# Patient Record
Sex: Female | Born: 1960 | Race: White | Hispanic: No | Marital: Married | Smoking: Never smoker
Health system: Southern US, Community
[De-identification: ages and names within clinical notes are randomized; demographics above are authoritative.]

## PROBLEM LIST (undated history)

## (undated) DIAGNOSIS — T7840XA Allergy, unspecified, initial encounter: Secondary | ICD-10-CM

## (undated) DIAGNOSIS — J45909 Unspecified asthma, uncomplicated: Secondary | ICD-10-CM

## (undated) DIAGNOSIS — K760 Fatty (change of) liver, not elsewhere classified: Secondary | ICD-10-CM

## (undated) DIAGNOSIS — F32A Depression, unspecified: Secondary | ICD-10-CM

## (undated) DIAGNOSIS — I10 Essential (primary) hypertension: Secondary | ICD-10-CM

## (undated) DIAGNOSIS — K589 Irritable bowel syndrome without diarrhea: Secondary | ICD-10-CM

## (undated) HISTORY — DX: Allergy, unspecified, initial encounter: T78.40XA

## (undated) HISTORY — DX: Irritable bowel syndrome, unspecified: K58.9

## (undated) HISTORY — DX: Essential (primary) hypertension: I10

---

## 1997-12-17 ENCOUNTER — Other Ambulatory Visit: Admission: RE | Admit: 1997-12-17 | Discharge: 1997-12-17 | Payer: Self-pay | Admitting: Obstetrics and Gynecology

## 1998-12-23 ENCOUNTER — Other Ambulatory Visit: Admission: RE | Admit: 1998-12-23 | Discharge: 1998-12-23 | Payer: Self-pay | Admitting: Obstetrics and Gynecology

## 1999-03-21 ENCOUNTER — Ambulatory Visit (HOSPITAL_COMMUNITY): Admission: RE | Admit: 1999-03-21 | Discharge: 1999-03-21 | Payer: Self-pay | Admitting: Obstetrics and Gynecology

## 1999-03-21 ENCOUNTER — Encounter: Payer: Self-pay | Admitting: Obstetrics and Gynecology

## 1999-06-13 HISTORY — PX: OVARIAN CYST REMOVAL: SHX89

## 2001-02-21 ENCOUNTER — Other Ambulatory Visit: Admission: RE | Admit: 2001-02-21 | Discharge: 2001-02-21 | Payer: Self-pay | Admitting: Gynecology

## 2002-02-13 ENCOUNTER — Other Ambulatory Visit: Admission: RE | Admit: 2002-02-13 | Discharge: 2002-02-13 | Payer: Self-pay | Admitting: Gynecology

## 2003-02-05 ENCOUNTER — Other Ambulatory Visit: Admission: RE | Admit: 2003-02-05 | Discharge: 2003-02-05 | Payer: Self-pay | Admitting: Gynecology

## 2004-01-11 ENCOUNTER — Other Ambulatory Visit: Admission: RE | Admit: 2004-01-11 | Discharge: 2004-01-11 | Payer: Self-pay | Admitting: Gynecology

## 2005-03-09 ENCOUNTER — Other Ambulatory Visit: Admission: RE | Admit: 2005-03-09 | Discharge: 2005-03-09 | Payer: Self-pay | Admitting: Gynecology

## 2006-04-05 ENCOUNTER — Other Ambulatory Visit: Admission: RE | Admit: 2006-04-05 | Discharge: 2006-04-05 | Payer: Self-pay | Admitting: Gynecology

## 2007-05-02 ENCOUNTER — Other Ambulatory Visit: Admission: RE | Admit: 2007-05-02 | Discharge: 2007-05-02 | Payer: Self-pay | Admitting: Gynecology

## 2007-06-13 HISTORY — PX: REDUCTION MAMMAPLASTY: SUR839

## 2007-06-13 HISTORY — PX: BREAST SURGERY: SHX581

## 2008-04-16 ENCOUNTER — Other Ambulatory Visit: Admission: RE | Admit: 2008-04-16 | Discharge: 2008-04-16 | Payer: Self-pay | Admitting: Gynecology

## 2010-06-12 LAB — HM MAMMOGRAPHY

## 2012-06-12 HISTORY — PX: FOOT SURGERY: SHX648

## 2012-12-23 LAB — HM COLONOSCOPY

## 2013-04-28 ENCOUNTER — Encounter: Payer: Self-pay | Admitting: Internal Medicine

## 2013-04-29 ENCOUNTER — Encounter: Payer: 59 | Admitting: Internal Medicine

## 2013-04-29 DIAGNOSIS — I10 Essential (primary) hypertension: Secondary | ICD-10-CM | POA: Insufficient documentation

## 2013-04-29 NOTE — Progress Notes (Signed)
Patient ID: Donna Chapman, female   DOB: Jun 02, 1961, 52 y.o.   MRN: [id number]   Annual Screening Comprehensive Examination   This very nice 52 yo MWF presents for complete physical.  Patient has no major health issues.    She does have history of volume dependent Hypertension which has been easily controlled with a diuretic.     Finally, the patient has history of Vitamin D Deficiency with last vitamin D      Current outpatient prescriptions: aspirin EC 81 MG tablet, Take 81 mg by mouth daily. 3 times per week, Disp: , Rfl: ;   buPROPion (WELLBUTRIN XL) 150 MG 24 hr tablet, Take 150 mg by mouth daily., Disp: , Rfl: ;  Cholecalciferol (VITAMIN D-3) 5000 UNITS TABS, Take 5,000 Units by mouth., Disp: , Rfl: ;  hydrochlorothiazide (HYDRODIURIL) 25 MG tablet, Take 25 mg by mouth daily., Disp: , Rfl:  loratadine (CLARITIN) 10 MG tablet, Take 10 mg by mouth daily., Disp: , Rfl:    Allergies not on file  Past Medical History  Diagnosis Date  . Hypertension   . Allergy     Past Surgical History  Procedure Laterality Date  . Ovarian cyst removal  2001  . Breast surgery Bilateral 2009    Reduction    Family History  Problem Relation Age of Onset  . Hypertension Mother   . Hypertension Father     History   Social History  . Marital Status: Married    Spouse Name: N/A    Number of Children: N/A  . Years of Education: N/A   Occupational History  . Not on file.   Social History Main Topics  . Smoking status: Never Smoker   . Smokeless tobacco: Not on file  . Alcohol Use: Yes  . Drug Use: No  . Sexual Activity: Not on file   Other Topics Concern  . Not on file   Social History Narrative  . No narrative on file    ROS Constitutional: Denies fever, chills, weight loss/gain, headaches, insomnia, fatigue, night sweats, and change in appetite. Eyes: Denies redness, blurred vision, diplopia, discharge, itchy, watery eyes.  ENT: Denies discharge, congestion, post  nasal drip, epistaxis, sore throat, earache, hearing loss, dental pain, Tinnitus, Vertigo, Sinus pain, snoring.  Cardio: Denies chest pain, palpitations, irregular heartbeat, syncope, dyspnea, diaphoresis, orthopnea, PND, claudication, edema Respiratory: denies cough, dyspnea, DOE, pleurisy, hoarseness, laryngitis, wheezing.  Gastrointestinal: Denies dysphagia, heartburn, reflux, water brash, pain, cramps, nausea, vomiting, bloating, diarrhea, constipation, hematemesis, melena, hematochezia, jaundice, hemorrhoids Genitourinary: Denies dysuria, frequency, urgency, nocturia, hesitancy, discharge, hematuria, flank pain Breast: Breast lumps, nipple discharge, bleeding.  Musculoskeletal: Denies arthralgia, myalgia, stiffness, Jt. Swelling, pain, limp, and strain/sprain. Skin: Denies puritis, rash, hives, warts, acne, eczema, changing in skin lesion Neuro: Weakness, tremor, incoordination, spasms, paresthesia, pain Psychiatric: Denies confusion, memory loss, sensory loss. Endocrine: Denies change in weight, skin, hair change, nocturia, and paresthesia, diabetic polys, visual blurring, hyper /hypo glycemic episodes.  Heme/Lymph: No excessive bleeding, bruising, enlarged lymph nodes.  There were no vitals filed for this visit. There is no height or weight on file to calculate BMI.  Physical Exam General Appearance: Well nourished, in no apparent distress. Eyes: PERRLA, EOMs, conjunctiva no swelling or erythema, normal fundi and vessels. Sinuses: No frontal/maxillary tenderness ENT/Mouth: EACs patent / TMs  nl. Nares clear without erythema, swelling, mucoid exudates. Oral hygiene is good. No erythema, swelling, or exudate. Tongue normal, non-obstructing. Tonsils not swollen or erythematous. Hearing normal.  Neck: Supple,  thyroid normal. No bruits, nodes or JVD. Respiratory: Respiratory effort normal.  BS equal and clear bilateral without rales, rhonci, wheezing or stridor. Cardio: Heart sounds are  normal with regular rate and rhythm and no murmurs, rubs or gallops. Peripheral pulses are normal and equal bilaterally without edema. No aortic or femoral bruits. Chest: symmetric with normal excursions and percussion. Breasts: Symmetric, without lumps, nipple discharge, retractions, or fibrocystic changes.  Abdomen: Flat, soft, with bowl sounds. Nontender, no guarding, rebound, hernias, masses, or organomegaly.  Lymphatics: Non tender without lymphadenopathy.  Genitourinary:  Musculoskeletal: Full ROM all peripheral extremities, joint stability, 5/5 strength, and normal gait. Skin: Warm and dry without rashes, lesions, cyanosis, clubbing or  ecchymosis.  Neuro: Cranial nerves intact, reflexes equal bilaterally. Normal muscle tone, no cerebellar symptoms. Sensation intact.  Pysch: Awake and oriented X 3, normal affect, Insight and Judgment appropriate.   Assessment and Plan  1. Annual Screening Examination 2. HTN 3.   4.    Continue prudent diet as discussed, weight control, regular exercise, and medications. Routine screening labs and tests as requested with regular follow-up as recommended.     This encounter was created in error - please disregard. This encounter was created in error - please disregard.

## 2013-06-12 HISTORY — PX: COLONOSCOPY: SHX174

## 2013-06-26 ENCOUNTER — Other Ambulatory Visit: Payer: Self-pay | Admitting: Emergency Medicine

## 2013-06-26 MED ORDER — FLUTICASONE PROPIONATE 50 MCG/ACT NA SUSP: 1.0000 | Freq: Every day | NASAL | Status: DC

## 2013-07-04 ENCOUNTER — Other Ambulatory Visit: Payer: Self-pay | Admitting: Physician Assistant

## 2013-07-04 MED ORDER — BUPROPION HCL ER (XL) 150 MG PO TB24: 150.0000 mg | ORAL_TABLET | Freq: Every day | ORAL | Status: DC

## 2013-10-09 ENCOUNTER — Encounter: Payer: Self-pay | Admitting: Internal Medicine

## 2013-10-09 ENCOUNTER — Ambulatory Visit (INDEPENDENT_AMBULATORY_CARE_PROVIDER_SITE_OTHER): Payer: 59 | Admitting: Internal Medicine

## 2013-10-09 VITALS — BP 102/76 | HR 80 | Temp 97.7°F | Resp 16 | Ht 64.0 in | Wt 181.0 lb

## 2013-10-09 DIAGNOSIS — R7402 Elevation of levels of lactic acid dehydrogenase (LDH): Secondary | ICD-10-CM

## 2013-10-09 DIAGNOSIS — Z1212 Encounter for screening for malignant neoplasm of rectum: Secondary | ICD-10-CM

## 2013-10-09 DIAGNOSIS — R740 Nonspecific elevation of levels of transaminase and lactic acid dehydrogenase [LDH]: Secondary | ICD-10-CM

## 2013-10-09 DIAGNOSIS — E559 Vitamin D deficiency, unspecified: Secondary | ICD-10-CM

## 2013-10-09 DIAGNOSIS — Z113 Encounter for screening for infections with a predominantly sexual mode of transmission: Secondary | ICD-10-CM

## 2013-10-09 DIAGNOSIS — Z111 Encounter for screening for respiratory tuberculosis: Secondary | ICD-10-CM

## 2013-10-09 DIAGNOSIS — Z0000 Encounter for general adult medical examination without abnormal findings: Secondary | ICD-10-CM

## 2013-10-09 DIAGNOSIS — I10 Essential (primary) hypertension: Secondary | ICD-10-CM

## 2013-10-09 LAB — CBC WITH DIFFERENTIAL/PLATELET
Basophils Absolute: 0 10*3/uL (ref 0.0–0.1)
Basophils Relative: 0 % (ref 0–1)
EOS ABS: 0.4 10*3/uL (ref 0.0–0.7)
Eosinophils Relative: 4 % (ref 0–5)
HEMATOCRIT: 43.7 % (ref 36.0–46.0)
Hemoglobin: 15 g/dL (ref 12.0–15.0)
LYMPHS ABS: 3.2 10*3/uL (ref 0.7–4.0)
LYMPHS PCT: 36 % (ref 12–46)
MCH: 30.7 pg (ref 26.0–34.0)
MCHC: 34.3 g/dL (ref 30.0–36.0)
MCV: 89.5 fL (ref 78.0–100.0)
MONO ABS: 0.6 10*3/uL (ref 0.1–1.0)
MONOS PCT: 7 % (ref 3–12)
Neutro Abs: 4.8 10*3/uL (ref 1.7–7.7)
Neutrophils Relative %: 53 % (ref 43–77)
PLATELETS: 186 10*3/uL (ref 150–400)
RBC: 4.88 MIL/uL (ref 3.87–5.11)
RDW: 13.7 % (ref 11.5–15.5)
WBC: 9 10*3/uL (ref 4.0–10.5)

## 2013-10-09 MED ORDER — TERBINAFINE HCL 250 MG PO TABS: 250.0000 mg | ORAL_TABLET | Freq: Every day | ORAL | Status: DC

## 2013-10-09 MED ORDER — ALPRAZOLAM 0.5 MG PO TABS: ORAL_TABLET | ORAL | Status: AC

## 2013-10-09 MED ORDER — BUPROPION HCL ER (XL) 150 MG PO TB24: 150.0000 mg | ORAL_TABLET | Freq: Every day | ORAL | Status: DC

## 2013-10-09 MED ORDER — HYDROCHLOROTHIAZIDE 25 MG PO TABS: 25.0000 mg | ORAL_TABLET | Freq: Every day | ORAL | Status: DC

## 2013-10-09 NOTE — Progress Notes (Signed)
Patient ID: Donna Chapman, female   DOB: 1961-03-13, 53 y.o.   MRN: [id number]   Annual Screening Comprehensive Examination  This very nice 53 y.o. MWF presents for complete physical.  Patient has been followed for HTN and Vitamin D Deficiency.    HTN predates since  2010. Patient's BP has been controlled and today's BP is 102/76 mmHg. Patient denies any cardiac symptoms as chest pain, palpitations, shortness of breath, dizziness or ankle swelling.   Finally, patient has history of Vitamin D Deficiency of 47 in 2009 and last vitamin D was 70 in Nov 2013.    Medication List       ALPRAZolam 0.5 MG tablet  Commonly known as:  XANAX  Take 1/2 to 1 tablet 3 x daily as needed for anxiety or sleep     aspirin EC 81 MG tablet  Take 81 mg by mouth daily. 3 times per week     buPROPion 150 MG 24 hr tablet  Commonly known as:  WELLBUTRIN XL  Take 1 tablet (150 mg total) by mouth daily. For mood or concentration     estradiol-norethindrone 0.05-0.14 MG/DAY  Commonly known as:  COMBIPATCH  Place 1 patch onto the skin 2 (two) times a week.     hydrochlorothiazide 25 MG tablet  Commonly known as:  HYDRODIURIL  Take 1 tablet (25 mg total) by mouth daily. For BP & fluid     loratadine 10 MG tablet  Commonly known as:  CLARITIN  Take 10 mg by mouth daily.     NASACORT AQ NA  Place into the nose. 1 spray into nostrils daily-OTC     OVER THE COUNTER MEDICATION  Osteobioflex 1 daily     OVER THE COUNTER MEDICATION  Aleve 2 tabs daily     terbinafine 250 MG tablet  Commonly known as:  LAMISIL  Take 1 tablet (250 mg total) by mouth daily. For toenail fungus - need  lab to check liver enzymes in 6 weeks     VITAMIN D PO  Take 2,000 Units by mouth 2 (two) times daily.       No Known Allergies  Past Medical History  Diagnosis Date  . Hypertension   . Allergy     Past Surgical History  Procedure Laterality Date  . Ovarian cyst removal  2001  . Breast surgery Bilateral 2009   Reduction    Family History  Problem Relation Age of Onset  . Hypertension Mother   . Hypertension Father     History  Substance Use Topics  . Smoking status: Never Smoker   . Smokeless tobacco: Not on file  . Alcohol Use: No    ROS Constitutional: Denies fever, chills, weight loss/gain, headaches, insomnia, fatigue, night sweats, and change in appetite. Eyes: Denies redness, blurred vision, diplopia, discharge, itchy, watery eyes.  ENT: Denies discharge, congestion, post nasal drip, epistaxis, sore throat, earache, hearing loss, dental pain, Tinnitus, Vertigo, Sinus pain, snoring.  Cardio: Denies chest pain, palpitations, irregular heartbeat, syncope, dyspnea, diaphoresis, orthopnea, PND, claudication, edema Respiratory: denies cough, dyspnea, DOE, pleurisy, hoarseness, laryngitis, wheezing.  Gastrointestinal: Denies dysphagia, heartburn, reflux, water brash, pain, cramps, nausea, vomiting, bloating, diarrhea, constipation, hematemesis, melena, hematochezia, jaundice, hemorrhoids Genitourinary: Denies dysuria, frequency, urgency, nocturia, hesitancy, discharge, hematuria, flank pain Breast:Breast lumps, nipple discharge, bleeding.  Musculoskeletal: Denies arthralgia, myalgia, stiffness, Jt. Swelling, pain, limp, and strain/sprain. Skin: Denies puritis, rash, hives, warts, acne, eczema, changing in skin lesion Neuro: No weakness, tremor, incoordination, spasms, paresthesia, pain  Psychiatric: Denies confusion, memory loss, sensory loss Endocrine: Denies change in weight, skin, hair change, nocturia, and paresthesia, diabetic polys, visual blurring, hyper / hypo glycemic episodes.  Heme/Lymph: No excessive bleeding, bruising, enlarged lymph nodes.   Physical Exam  BP 102/76  Pulse 80  Temp 97.7 F   Resp 16  Ht 5\' 4"    Wt 181 lb   BMI 31.05 kg/m2  General Appearance: Well nourished, in no apparent distress. Eyes: PERRLA, EOMs, conjunctiva no swelling or erythema, normal fundi  and vessels. Sinuses: No frontal/maxillary tenderness ENT/Mouth: EACs patent / TMs  nl. Nares clear without erythema, swelling, mucoid exudates. Oral hygiene is good. No erythema, swelling, or exudate. Tongue normal, non-obstructing. Tonsils not swollen or erythematous. Hearing normal.  Neck: Supple, thyroid normal. No bruits, nodes or JVD. Respiratory: Respiratory effort normal.  BS equal and clear bilateral without rales, rhonci, wheezing or stridor. Cardio: Heart sounds are normal with regular rate and rhythm and no murmurs, rubs or gallops. Peripheral pulses are normal and equal bilaterally without edema. No aortic or femoral bruits. Chest: symmetric with normal excursions and percussion. Abdomen: Flat, soft, with bowl sounds. Nontender, no guarding, rebound, hernias, masses, or organomegaly.  Lymphatics: Non tender without lymphadenopathy.  Genitourinary: due to see Dr Carren Rang soon for pap, Breast exam and MGM. Musculoskeletal: Full ROM all peripheral extremities, joint stability, 5/5 strength, and normal gait. Skin: Warm and dry without rashes, lesions, cyanosis, clubbing or  ecchymosis. Thickened chalky toenails. Neuro: Cranial nerves intact, reflexes equal bilaterally. Normal muscle tone, no cerebellar symptoms. Sensation intact.  Pysch: Awake and oriented X 3, normal affect, Insight and Judgment appropriate.   Assessment and Plan  1. Annual Screening Examination 2. Hypertension  3. Vitamin D Deficiency 4. Onychomycosis Toenails - Lamisil 250 mg qd x 3 mo recc liver profile in 6 weeks  Continue prudent diet as discussed, weight control, BP monitoring, regular exercise, and medications. Discussed med's effects and SE's. Screening labs and tests as requested with regular follow-up as recommended.

## 2013-10-09 NOTE — Patient Instructions (Signed)

## 2013-10-10 LAB — BASIC METABOLIC PANEL WITH GFR
BUN: 19 mg/dL (ref 6–23)
CALCIUM: 9.4 mg/dL (ref 8.4–10.5)
CHLORIDE: 103 meq/L (ref 96–112)
CO2: 28 mEq/L (ref 19–32)
Creat: 0.6 mg/dL (ref 0.50–1.10)
GFR, Est African American: >89 mL/min
GFR, Est Non African American: >89 mL/min
GLUCOSE: 79 mg/dL (ref 70–99)
Potassium: 3.5 mEq/L (ref 3.5–5.3)
Sodium: 141 mEq/L (ref 135–145)

## 2013-10-10 LAB — URINALYSIS, MICROSCOPIC ONLY
BACTERIA UA: NONE SEEN
Casts: NONE SEEN
Crystals: NONE SEEN
Squamous Epithelial / LPF: NONE SEEN

## 2013-10-10 LAB — LIPID PANEL
Cholesterol: 164 mg/dL (ref 0–200)
HDL: 47 mg/dL (ref >39)
LDL CALC: 86 mg/dL (ref 0–99)
Total CHOL/HDL Ratio: 3.5 Ratio
Triglycerides: 155 mg/dL — ABNORMAL HIGH (ref <150)
VLDL: 31 mg/dL (ref 0–40)

## 2013-10-10 LAB — RPR

## 2013-10-10 LAB — HEPATIC FUNCTION PANEL
ALBUMIN: 4.3 g/dL (ref 3.5–5.2)
ALK PHOS: 74 U/L (ref 39–117)
ALT: 19 U/L (ref 0–35)
AST: 20 U/L (ref 0–37)
BILIRUBIN TOTAL: 0.4 mg/dL (ref 0.2–1.2)
Bilirubin, Direct: 0.1 mg/dL (ref 0.0–0.3)
Indirect Bilirubin: 0.3 mg/dL (ref 0.2–1.2)
TOTAL PROTEIN: 6.4 g/dL (ref 6.0–8.3)

## 2013-10-10 LAB — HEMOGLOBIN A1C
HEMOGLOBIN A1C: 5.3 % (ref <5.7)
Mean Plasma Glucose: 105 mg/dL (ref <117)

## 2013-10-10 LAB — HEPATITIS B SURFACE ANTIBODY,QUALITATIVE: Hep B S Ab: POSITIVE — AB

## 2013-10-10 LAB — MICROALBUMIN / CREATININE URINE RATIO
CREATININE, URINE: 60.6 mg/dL
MICROALB/CREAT RATIO: 8.3 mg/g (ref 0.0–30.0)
Microalb, Ur: 0.5 mg/dL (ref 0.00–1.89)

## 2013-10-10 LAB — HEPATITIS A ANTIBODY, TOTAL: HEP A TOTAL AB: REACTIVE — AB

## 2013-10-10 LAB — VITAMIN B12: VITAMIN B 12: 379 pg/mL (ref 211–911)

## 2013-10-10 LAB — MAGNESIUM: MAGNESIUM: 2 mg/dL (ref 1.5–2.5)

## 2013-10-10 LAB — VITAMIN D 25 HYDROXY (VIT D DEFICIENCY, FRACTURES): Vit D, 25-Hydroxy: 62 ng/mL (ref 30–89)

## 2013-10-10 LAB — HIV ANTIBODY (ROUTINE TESTING W REFLEX): HIV 1&2 Ab, 4th Generation: NONREACTIVE

## 2013-10-10 LAB — TSH: TSH: 1.095 u[IU]/mL (ref 0.350–4.500)

## 2013-10-10 LAB — HEPATITIS B CORE ANTIBODY, TOTAL: Hep B Core Total Ab: NONREACTIVE

## 2013-10-10 LAB — HEPATITIS C ANTIBODY: HCV Ab: NEGATIVE

## 2013-10-10 LAB — INSULIN, FASTING: INSULIN FASTING, SERUM: 6 u[IU]/mL (ref 3–28)

## 2013-10-13 LAB — HEPATITIS B E ANTIBODY: Hepatitis Be Antibody: NONREACTIVE

## 2013-10-14 LAB — TB SKIN TEST
INDURATION: 0 mm
TB Skin Test: NEGATIVE

## 2013-12-29 ENCOUNTER — Other Ambulatory Visit: Payer: Self-pay | Admitting: Internal Medicine

## 2014-04-30 ENCOUNTER — Encounter: Payer: Self-pay | Admitting: Internal Medicine

## 2014-08-12 ENCOUNTER — Telehealth: Payer: Self-pay | Admitting: *Deleted

## 2014-08-12 MED ORDER — AZITHROMYCIN 250 MG PO TABS: ORAL_TABLET | ORAL | Status: DC

## 2014-08-12 NOTE — Telephone Encounter (Signed)
Patient called and states she is having sinus drainage, pressure with a cough and green mucus.  Ok to send in an RX for patient per Dr Melford Aase.

## 2014-10-12 ENCOUNTER — Encounter: Payer: Self-pay | Admitting: Internal Medicine

## 2014-10-12 ENCOUNTER — Ambulatory Visit (INDEPENDENT_AMBULATORY_CARE_PROVIDER_SITE_OTHER): Payer: 59 | Admitting: Internal Medicine

## 2014-10-12 VITALS — BP 112/62 | HR 86 | Temp 98.0°F | Resp 16 | Ht 64.0 in | Wt 193.0 lb

## 2014-10-12 DIAGNOSIS — R1013 Epigastric pain: Secondary | ICD-10-CM

## 2014-10-12 MED ORDER — OMEPRAZOLE 40 MG PO CPDR: 40.0000 mg | DELAYED_RELEASE_CAPSULE | Freq: Every day | ORAL | Status: DC

## 2014-10-12 MED ORDER — SUCRALFATE 1 G PO TABS: 1.0000 g | ORAL_TABLET | Freq: Four times a day (QID) | ORAL | Status: DC

## 2014-10-12 NOTE — Progress Notes (Signed)
Subjective:    Patient ID: Donna Chapman, female    DOB: 07/01/1960, 54 y.o.   MRN: [id number]  Abdominal Pain Associated symptoms include constipation (straining to have stools) and nausea. Pertinent negatives include no diarrhea, dysuria, fever, frequency, hematuria or vomiting. Her past medical history is significant for GERD.  Gastrophageal Reflux She complains of abdominal pain and nausea. She reports no chest pain, no coughing or no wheezing.   Patient reports that on Friday she developed upper abdominal pain which was dull and aching and seems to come and go.  It is worse after eating.  She has tried taking tums and also drinking sprite and seems to have minimal effect.  She reports that laying down made her pain slightly worse.  She reports no previous episodes of this in the past.  She does have a history of IBS and mild GERD in the past.  She has never had abdominal surgeries in the past.  She does report some mild left shoulder pain.  She does not report family history of gallstones.  She does report that she does take Aleve nearly daily.  She reports that it is a long term medication.     Review of Systems  Constitutional: Negative for fever and chills.  Respiratory: Negative for cough, shortness of breath and wheezing.   Cardiovascular: Negative for chest pain.  Gastrointestinal: Positive for nausea, abdominal pain and constipation (straining to have stools). Negative for vomiting, diarrhea and blood in stool.  Genitourinary: Negative for dysuria, urgency, frequency, hematuria, flank pain and difficulty urinating.  All other systems reviewed and are negative.      Objective:   Physical Exam  Constitutional: She is oriented to person, place, and time. She appears well-developed and well-nourished. No distress.  HENT:  Head: Normocephalic and atraumatic.  Mouth/Throat: Oropharynx is clear and moist. No oropharyngeal exudate.  Eyes: Conjunctivae and EOM are normal. Pupils  are equal, round, and reactive to light. No scleral icterus.  Neck: Normal range of motion. Neck supple. No JVD present. No thyromegaly present.  Cardiovascular: Normal rate, regular rhythm, normal heart sounds and intact distal pulses.  Exam reveals no gallop and no friction rub.   No murmur heard. Pulmonary/Chest: Effort normal and breath sounds normal. No respiratory distress. She has no wheezes. She has no rales. She exhibits no tenderness.  Abdominal: Soft. Normal appearance and bowel sounds are normal. She exhibits no distension and no mass. There is no hepatosplenomegaly. There is tenderness in the epigastric area. There is no rebound, no guarding, no CVA tenderness, no tenderness at McBurney's point and negative Murphy's sign.  Musculoskeletal: Normal range of motion.  Lymphadenopathy:    She has no cervical adenopathy.  Neurological: She is alert and oriented to person, place, and time.  Skin: Skin is warm and dry. She is not diaphoretic.  Psychiatric: She has a normal mood and affect. Her behavior is normal. Judgment and thought content normal.  Nursing note and vitals reviewed.   Filed Vitals:   10/12/14 0920  BP: 112/62  Pulse: 86  Temp: 98 F (36.7 C)  Resp: 16        Assessment & Plan:    1. Abdominal pain, epigastric Ddx GERD, gastritis, PUD, less likely pancreatitis or cholelithiasis/gallbladder dysfuction. Patient does take Aleve daily.  Likely GERD.  Will try 2 week trial for treatment of omeprazole and carafate.  If no improvement than we need to schedule ultrasound RUQ and get labs.  Abdominal exam benign.    -  omeprazole (PRILOSEC) 40 MG capsule; Take 1 capsule (40 mg total) by mouth daily.  Dispense: 30 capsule; Refill: 1 - sucralfate (CARAFATE) 1 G tablet; Take 1 tablet (1 g total) by mouth 4 (four) times daily.  Dispense: 120 tablet; Refill: 1

## 2014-10-12 NOTE — Patient Instructions (Signed)

## 2014-10-13 ENCOUNTER — Encounter: Payer: Self-pay | Admitting: Internal Medicine

## 2014-10-15 ENCOUNTER — Encounter: Payer: Self-pay | Admitting: Internal Medicine

## 2014-10-18 ENCOUNTER — Encounter: Payer: Self-pay | Admitting: Internal Medicine

## 2014-10-19 ENCOUNTER — Other Ambulatory Visit: Payer: Commercial Managed Care - HMO

## 2014-10-19 ENCOUNTER — Other Ambulatory Visit: Payer: Self-pay | Admitting: Internal Medicine

## 2014-10-19 DIAGNOSIS — R1013 Epigastric pain: Secondary | ICD-10-CM

## 2014-10-19 LAB — CBC WITH DIFFERENTIAL/PLATELET
BASOS ABS: 0.1 10*3/uL (ref 0.0–0.1)
Basophils Relative: 1 % (ref 0–1)
Eosinophils Absolute: 0.4 10*3/uL (ref 0.0–0.7)
Eosinophils Relative: 3 % (ref 0–5)
HEMATOCRIT: 46.1 % — AB (ref 36.0–46.0)
Hemoglobin: 15.6 g/dL — ABNORMAL HIGH (ref 12.0–15.0)
Lymphocytes Relative: 33 % (ref 12–46)
Lymphs Abs: 4.3 10*3/uL — ABNORMAL HIGH (ref 0.7–4.0)
MCH: 30.5 pg (ref 26.0–34.0)
MCHC: 33.8 g/dL (ref 30.0–36.0)
MCV: 90 fL (ref 78.0–100.0)
MONOS PCT: 8 % (ref 3–12)
MPV: 11.5 fL (ref 8.6–12.4)
Monocytes Absolute: 1 10*3/uL (ref 0.1–1.0)
NEUTROS ABS: 7.1 10*3/uL (ref 1.7–7.7)
NEUTROS PCT: 55 % (ref 43–77)
PLATELETS: 283 10*3/uL (ref 150–400)
RBC: 5.12 MIL/uL — ABNORMAL HIGH (ref 3.87–5.11)
RDW: 14.8 % (ref 11.5–15.5)
WBC: 12.9 10*3/uL — AB (ref 4.0–10.5)

## 2014-10-19 LAB — BASIC METABOLIC PANEL WITH GFR
BUN: 15 mg/dL (ref 6–23)
CHLORIDE: 104 meq/L (ref 96–112)
CO2: 30 meq/L (ref 19–32)
Calcium: 9.3 mg/dL (ref 8.4–10.5)
Creat: 0.82 mg/dL (ref 0.50–1.10)
GFR, Est African American: >89 mL/min
GFR, Est Non African American: 83 mL/min
GLUCOSE: 77 mg/dL (ref 70–99)
Potassium: 3.6 mEq/L (ref 3.5–5.3)
SODIUM: 142 meq/L (ref 135–145)

## 2014-10-19 LAB — HEPATIC FUNCTION PANEL
ALK PHOS: 56 U/L (ref 39–117)
ALT: 22 U/L (ref 0–35)
AST: 29 U/L (ref 0–37)
Albumin: 3.8 g/dL (ref 3.5–5.2)
BILIRUBIN INDIRECT: 0.5 mg/dL (ref 0.2–1.2)
BILIRUBIN TOTAL: 0.7 mg/dL (ref 0.2–1.2)
Bilirubin, Direct: 0.2 mg/dL (ref 0.0–0.3)
Total Protein: 6.2 g/dL (ref 6.0–8.3)

## 2014-10-19 LAB — LIPASE: LIPASE: 68 U/L (ref 0–75)

## 2014-10-19 LAB — MAGNESIUM: MAGNESIUM: 2.1 mg/dL (ref 1.5–2.5)

## 2014-10-22 ENCOUNTER — Ambulatory Visit
Admission: RE | Admit: 2014-10-22 | Discharge: 2014-10-22 | Disposition: A | Discharge status: Home / Self Care | Payer: 59 | Source: Ambulatory Visit | Attending: Internal Medicine | Admitting: Internal Medicine

## 2014-10-22 DIAGNOSIS — R1013 Epigastric pain: Secondary | ICD-10-CM

## 2014-10-23 ENCOUNTER — Encounter: Payer: Self-pay | Admitting: Internal Medicine

## 2014-10-23 ENCOUNTER — Other Ambulatory Visit: Payer: Self-pay

## 2014-10-23 ENCOUNTER — Other Ambulatory Visit: Payer: Self-pay | Admitting: Internal Medicine

## 2014-10-23 DIAGNOSIS — K769 Liver disease, unspecified: Secondary | ICD-10-CM

## 2014-10-23 MED ORDER — HYDROCORTISONE ACETATE 25 MG RE SUPP: 25.0000 mg | Freq: Two times a day (BID) | RECTAL | Status: DC

## 2014-10-23 MED ORDER — DICYCLOMINE HCL 20 MG PO TABS: 20.0000 mg | ORAL_TABLET | Freq: Three times a day (TID) | ORAL | Status: DC | PRN

## 2014-10-25 ENCOUNTER — Other Ambulatory Visit: Payer: Self-pay | Admitting: Internal Medicine

## 2014-11-02 ENCOUNTER — Other Ambulatory Visit: Payer: Self-pay | Admitting: Internal Medicine

## 2014-11-02 ENCOUNTER — Ambulatory Visit
Admission: RE | Admit: 2014-11-02 | Discharge: 2014-11-02 | Disposition: A | Discharge status: Home / Self Care | Payer: 59 | Source: Ambulatory Visit | Attending: Internal Medicine | Admitting: Internal Medicine

## 2014-11-02 ENCOUNTER — Encounter: Payer: Self-pay | Admitting: Internal Medicine

## 2014-11-02 DIAGNOSIS — K769 Liver disease, unspecified: Secondary | ICD-10-CM

## 2014-11-02 DIAGNOSIS — K589 Irritable bowel syndrome without diarrhea: Secondary | ICD-10-CM

## 2014-11-02 MED ORDER — IOHEXOL 300 MG/ML  SOLN
100.0000 mL | Freq: Once | INTRAMUSCULAR | Status: AC | PRN
  Administered 2014-11-02: 100 mL via INTRAVENOUS

## 2014-11-02 MED ORDER — DICYCLOMINE HCL 20 MG PO TABS: ORAL_TABLET | ORAL | Status: DC

## 2014-12-24 ENCOUNTER — Encounter: Payer: Self-pay | Admitting: Internal Medicine

## 2014-12-24 ENCOUNTER — Ambulatory Visit (INDEPENDENT_AMBULATORY_CARE_PROVIDER_SITE_OTHER): Payer: Commercial Managed Care - HMO | Admitting: Internal Medicine

## 2014-12-24 VITALS — BP 136/88 | HR 72 | Temp 97.3°F | Resp 16 | Ht 63.75 in | Wt 184.6 lb

## 2014-12-24 DIAGNOSIS — Z1212 Encounter for screening for malignant neoplasm of rectum: Secondary | ICD-10-CM

## 2014-12-24 DIAGNOSIS — Z111 Encounter for screening for respiratory tuberculosis: Secondary | ICD-10-CM

## 2014-12-24 DIAGNOSIS — E559 Vitamin D deficiency, unspecified: Secondary | ICD-10-CM | POA: Insufficient documentation

## 2014-12-24 DIAGNOSIS — E782 Mixed hyperlipidemia: Secondary | ICD-10-CM

## 2014-12-24 DIAGNOSIS — Z6831 Body mass index (BMI) 31.0-31.9, adult: Secondary | ICD-10-CM

## 2014-12-24 DIAGNOSIS — R5383 Other fatigue: Secondary | ICD-10-CM

## 2014-12-24 DIAGNOSIS — R7309 Other abnormal glucose: Secondary | ICD-10-CM

## 2014-12-24 DIAGNOSIS — I10 Essential (primary) hypertension: Secondary | ICD-10-CM

## 2014-12-24 DIAGNOSIS — Z79899 Other long term (current) drug therapy: Secondary | ICD-10-CM

## 2014-12-24 DIAGNOSIS — Z0000 Encounter for general adult medical examination without abnormal findings: Secondary | ICD-10-CM

## 2014-12-24 LAB — CBC WITH DIFFERENTIAL/PLATELET
Basophils Absolute: 0.1 10*3/uL (ref 0.0–0.1)
Basophils Relative: 1 % (ref 0–1)
Eosinophils Absolute: 0.4 10*3/uL (ref 0.0–0.7)
Eosinophils Relative: 4 % (ref 0–5)
HCT: 42.6 % (ref 36.0–46.0)
Hemoglobin: 14.3 g/dL (ref 12.0–15.0)
LYMPHS ABS: 3.3 10*3/uL (ref 0.7–4.0)
Lymphocytes Relative: 31 % (ref 12–46)
MCH: 30.4 pg (ref 26.0–34.0)
MCHC: 33.6 g/dL (ref 30.0–36.0)
MCV: 90.6 fL (ref 78.0–100.0)
MPV: 13.5 fL — ABNORMAL HIGH (ref 8.6–12.4)
Monocytes Absolute: 1 10*3/uL (ref 0.1–1.0)
Monocytes Relative: 9 % (ref 3–12)
Neutro Abs: 5.9 10*3/uL (ref 1.7–7.7)
Neutrophils Relative %: 55 % (ref 43–77)
PLATELETS: 208 10*3/uL (ref 150–400)
RBC: 4.7 MIL/uL (ref 3.87–5.11)
RDW: 14.1 % (ref 11.5–15.5)
WBC: 10.7 10*3/uL — ABNORMAL HIGH (ref 4.0–10.5)

## 2014-12-24 LAB — TSH: TSH: 1.607 u[IU]/mL (ref 0.350–4.500)

## 2014-12-24 LAB — VITAMIN B12: Vitamin B-12: 547 pg/mL (ref 211–911)

## 2014-12-24 NOTE — Patient Instructions (Signed)
Recommend Adult Low dose Aspirin or coated  Aspirin 81 mg daily   To reduce risk of Colon Cancer 20 %,   Skin Cancer 26 % ,   Melanoma 46%   and   Pancreatic cancer 60% ++++++++++++++++++ Vitamin D goal is between 70-100.   Please make sure that you are taking your Vitamin D as directed.   It is very important as a natural anti-inflammatory   helping hair, skin, and nails, as well as reducing stroke and heart attack risk.   It helps your bones and helps with mood.  It also decreases numerous cancer risks so please take it as directed.   Low Vit D is associated with a 200-300% higher risk for CANCER   and 200-300% higher risk for HEART   ATTACK  &  STROKE.   ......................................  It is also associated with higher death rate at younger ages,   autoimmune diseases like Rheumatoid arthritis, Lupus, Multiple Sclerosis.     Also many other serious conditions, like depression, Alzheimer's  Dementia, infertility, muscle aches, fatigue, fibromyalgia - just to name a few.  +++++++++++++++++++  Recommend the book "The END of DIETING" by Dr Joel Fuhrman   & the book "The END of DIABETES " by Dr Joel Fuhrman  At [employer] - get book & Audio CD's     Being diabetic has a  300% increased risk for heart attack, stroke, cancer, and alzheimer- type vascular dementia. It is very important that you work harder with diet by avoiding all foods that are white. Avoid white rice (brown & wild rice is OK), white potatoes (sweetpotatoes in moderation is OK), White bread or wheat bread or anything made out of white flour like bagels, donuts, rolls, buns, biscuits, cakes, pastries, cookies, pizza crust, and pasta (made from white flour & egg whites) - vegetarian pasta or spinach or wheat pasta is OK. Multigrain breads like Arnold's or [employer], or multigrain sandwich thins or flatbreads.  Diet, exercise and weight loss can reverse and cure diabetes in the early stages.   Diet, exercise and weight loss is very important in the control and prevention of complications of diabetes which affects every system in your body, ie. Brain - dementia/stroke, eyes - glaucoma/blindness, heart - heart attack/heart failure, kidneys - dialysis, stomach - gastric paralysis, intestines - malabsorption, nerves - severe painful neuritis, circulation - gangrene & loss of a leg(s), and finally cancer and Alzheimers.    I recommend avoid fried & greasy foods,  sweets/candy, white rice (brown or wild rice or Quinoa is OK), white potatoes (sweet potatoes are OK) - anything made from white flour - bagels, doughnuts, rolls, buns, biscuits,white and wheat breads, pizza crust and traditional pasta made of white flour & egg white(vegetarian pasta or spinach or wheat pasta is OK).  Multi-grain bread is OK - like multi-grain flat bread or sandwich thins. Avoid alcohol in excess. Exercise is also important.    Eat all the vegetables you want - avoid meat, especially red meat and dairy - especially cheese.  Cheese is the most concentrated form of trans-fats which is the worst thing to clog up our arteries. Veggie cheese is OK which can be found in the fresh produce section at [employer] or [employer] or [employer]  ++++++++++++++++++++++++++   Preventive Care for Adults  A healthy lifestyle and preventive care can promote health and wellness. Preventive health guidelines for women include the following key practices.  A routine yearly physical is a good way   to check with your health care provider about your health and preventive screening. It is a chance to share any concerns and updates on your health and to receive a thorough exam.  Visit your dentist for a routine exam and preventive care every 6 months. Brush your teeth twice a day and floss once a day. Good oral hygiene prevents tooth decay and gum disease.  The frequency of eye exams is based on your age, health, family medical history, use  of contact lenses, and other factors. Follow your health care provider's recommendations for frequency of eye exams.  Eat a healthy diet. Foods like vegetables, fruits, whole grains, low-fat dairy products, and lean protein foods contain the nutrients you need without too many calories. Decrease your intake of foods high in solid fats, added sugars, and salt. Eat the right amount of calories for you.Get information about a proper diet from your health care provider, if necessary.  Regular physical exercise is one of the most important things you can do for your health. Most adults should get at least 150 minutes of moderate-intensity exercise (any activity that increases your heart rate and causes you to sweat) each week. In addition, most adults need muscle-strengthening exercises on 2 or more days a week.  Maintain a healthy weight. The body mass index (BMI) is a screening tool to identify possible weight problems. It provides an estimate of body fat based on height and weight. Your health care provider can find your BMI and can help you achieve or maintain a healthy weight.For adults 20 years and older:  A BMI below 18.5 is considered underweight.  A BMI of 18.5 to 24.9 is normal.  A BMI of 25 to 29.9 is considered overweight.  A BMI of 30 and above is considered obese.  Maintain normal blood lipids and cholesterol levels by exercising and minimizing your intake of saturated fat. Eat a balanced diet with plenty of fruit and vegetables. Blood tests for lipids and cholesterol should begin at age 49 and be repeated every 5 years. If your lipid or cholesterol levels are high, you are over 50, or you are at high risk for heart disease, you may need your cholesterol levels checked more frequently.Ongoing high lipid and cholesterol levels should be treated with medicines if diet and exercise are not working.  If you smoke, find out from your health care provider how to quit. If you do not use  tobacco, do not start.  Lung cancer screening is recommended for adults aged 6-80 years who are at high risk for developing lung cancer because of a history of smoking. A yearly low-dose CT scan of the lungs is recommended for people who have at least a 30-pack-year history of smoking and are a current smoker or have quit within the past 15 years. A pack year of smoking is smoking an average of 1 pack of cigarettes a day for 1 year (for example: 1 pack a day for 30 years or 2 packs a day for 15 years). Yearly screening should continue until the smoker has stopped smoking for at least 15 years. Yearly screening should be stopped for people who develop a health problem that would prevent them from having lung cancer treatment.  High blood pressure causes heart disease and increases the risk of stroke. Your blood pressure should be checked at least every 1 to 2 years. Ongoing high blood pressure should be treated with medicines if weight loss and exercise do not work.  If you  are 21-72 years old, ask your health care provider if you should take aspirin to prevent strokes.  Diabetes screening involves taking a blood sample to check your fasting blood sugar level. This should be done once every 3 years, after age 16, if you are within normal weight and without risk factors for diabetes. Testing should be considered at a younger age or be carried out more frequently if you are overweight and have at least 1 risk factor for diabetes.  Breast cancer screening is essential preventive care for women. You should practice "breast self-awareness." This means understanding the normal appearance and feel of your breasts and may include breast self-examination. Any changes detected, no matter how small, should be reported to a health care provider. Women in their 4s and 30s should have a clinical breast exam (CBE) by a health care provider as part of a regular health exam every 1 to 3 years. After age 39, women should  have a CBE every year. Starting at age 31, women should consider having a mammogram (breast X-ray test) every year. Women who have a family history of breast cancer should talk to their health care provider about genetic screening. Women at a high risk of breast cancer should talk to their health care providers about having an MRI and a mammogram every year.  Breast cancer gene (BRCA)-related cancer risk assessment is recommended for women who have family members with BRCA-related cancers. BRCA-related cancers include breast, ovarian, tubal, and peritoneal cancers. Having family members with these cancers may be associated with an increased risk for harmful changes (mutations) in the breast cancer genes BRCA1 and BRCA2. Results of the assessment will determine the need for genetic counseling and BRCA1 and BRCA2 testing.  Routine pelvic exams to screen for cancer are no longer recommended for nonpregnant women who are considered low risk for cancer of the pelvic organs (ovaries, uterus, and vagina) and who do not have symptoms. Ask your health care provider if a screening pelvic exam is right for you.  If you have had past treatment for cervical cancer or a condition that could lead to cancer, you need Pap tests and screening for cancer for at least 20 years after your treatment. If Pap tests have been discontinued, your risk factors (such as having a new sexual partner) need to be reassessed to determine if screening should be resumed. Some women have medical problems that increase the chance of getting cervical cancer. In these cases, your health care provider may recommend more frequent screening and Pap tests.  Colorectal cancer can be detected and often prevented. Most routine colorectal cancer screening begins at the age of 85 years and continues through age 75 years. However, your health care provider may recommend screening at an earlier age if you have risk factors for colon cancer. On a yearly  basis, your health care provider may provide home test kits to check for hidden blood in the stool. Use of a small camera at the end of a tube, to directly examine the colon (sigmoidoscopy or colonoscopy), can detect the earliest forms of colorectal cancer. Talk to your health care provider about this at age 83, when routine screening begins. Direct exam of the colon should be repeated every 5-10 years through age 31 years, unless early forms of pre-cancerous polyps or small growths are found.  Hepatitis C blood testing is recommended for all people born from [birth year] through 1965 and any individual with known risks for hepatitis C.  Pra  Osteoporosis  is a disease in which the bones lose minerals and strength with aging. This can result in serious bone fractures or breaks. The risk of osteoporosis can be identified using a bone density scan. Women ages 67 years and over and women at risk for fractures or osteoporosis should discuss screening with their health care providers. Ask your health care provider whether you should take a calcium supplement or vitamin D to reduce the rate of osteoporosis.  Menopause can be associated with physical symptoms and risks. Hormone replacement therapy is available to decrease symptoms and risks. You should talk to your health care provider about whether hormone replacement therapy is right for you.  Use sunscreen. Apply sunscreen liberally and repeatedly throughout the day. You should seek shade when your shadow is shorter than you. Protect yourself by wearing long sleeves, pants, a wide-brimmed hat, and sunglasses year round, whenever you are outdoors.  Once a month, do a whole body skin exam, using a mirror to look at the skin on your back. Tell your health care provider of new moles, moles that have irregular borders, moles that are larger than a pencil eraser, or moles that have changed in shape or color.  Stay current with required vaccines  (immunizations).  Influenza vaccine. All adults should be immunized every year.  Tetanus, diphtheria, and acellular pertussis (Td, Tdap) vaccine. Pregnant women should receive 1 dose of Tdap vaccine during each pregnancy. The dose should be obtained regardless of the length of time since the last dose. Immunization is preferred during the 27th-36th week of gestation. An adult who has not previously received Tdap or who does not know her vaccine status should receive 1 dose of Tdap. This initial dose should be followed by tetanus and diphtheria toxoids (Td) booster doses every 10 years. Adults with an unknown or incomplete history of completing a 3-dose immunization series with Td-containing vaccines should begin or complete a primary immunization series including a Tdap dose. Adults should receive a Td booster every 10 years.  Varicella vaccine. An adult without evidence of immunity to varicella should receive 2 doses or a second dose if she has previously received 1 dose. Pregnant females who do not have evidence of immunity should receive the first dose after pregnancy. This first dose should be obtained before leaving the health care facility. The second dose should be obtained 4-8 weeks after the first dose.  Human papillomavirus (HPV) vaccine. Females aged 13-26 years who have not received the vaccine previously should obtain the 3-dose series. The vaccine is not recommended for use in pregnant females. However, pregnancy testing is not needed before receiving a dose. If a female is found to be pregnant after receiving a dose, no treatment is needed. In that case, the remaining doses should be delayed until after the pregnancy. Immunization is recommended for any person with an immunocompromised condition through the age of 11 years if she did not get any or all doses earlier. During the 3-dose series, the second dose should be obtained 4-8 weeks after the first dose. The third dose should be obtained  24 weeks after the first dose and 16 weeks after the second dose.  Zoster vaccine. One dose is recommended for adults aged 58 years or older unless certain conditions are present.  Measles, mumps, and rubella (MMR) vaccine. Adults born before [birth year] generally are considered immune to measles and mumps. Adults born in [birth year] or later should have 1 or more doses of MMR vaccine unless there is a contraindication  to the vaccine or there is laboratory evidence of immunity to each of the three diseases. A routine second dose of MMR vaccine should be obtained at least 28 days after the first dose for students attending postsecondary schools, health care workers, or international travelers. People who received inactivated measles vaccine or an unknown type of measles vaccine during 1963-1967 should receive 2 doses of MMR vaccine. People who received inactivated mumps vaccine or an unknown type of mumps vaccine before 1979 and are at high risk for mumps infection should consider immunization with 2 doses of MMR vaccine. For females of childbearing age, rubella immunity should be determined. If there is no evidence of immunity, females who are not pregnant should be vaccinated. If there is no evidence of immunity, females who are pregnant should delay immunization until after pregnancy. Unvaccinated health care workers born before [birth year] who lack laboratory evidence of measles, mumps, or rubella immunity or laboratory confirmation of disease should consider measles and mumps immunization with 2 doses of MMR vaccine or rubella immunization with 1 dose of MMR vaccine.  Pneumococcal 13-valent conjugate (PCV13) vaccine. When indicated, a person who is uncertain of her immunization history and has no record of immunization should receive the PCV13 vaccine. An adult aged 26 years or older who has certain medical conditions and has not been previously immunized should receive 1 dose of PCV13 vaccine. This PCV13 should be followed  with a dose of pneumococcal polysaccharide (PPSV23) vaccine. The PPSV23 vaccine dose should be obtained at least 8 weeks after the dose of PCV13 vaccine. An adult aged 78 years or older who has certain medical conditions and previously received 1 or more doses of PPSV23 vaccine should receive 1 dose of PCV13. The PCV13 vaccine dose should be obtained 1 or more years after the last PPSV23 vaccine dose.    Pneumococcal polysaccharide (PPSV23) vaccine. When PCV13 is also indicated, PCV13 should be obtained first. All adults aged 9 years and older should be immunized. An adult younger than age 13 years who has certain medical conditions should be immunized. Any person who resides in a nursing home or long-term care facility should be immunized. An adult smoker should be immunized. People with an immunocompromised condition and certain other conditions should receive both PCV13 and PPSV23 vaccines. People with human immunodeficiency virus (HIV) infection should be immunized as soon as possible after diagnosis. Immunization during chemotherapy or radiation therapy should be avoided. Routine use of PPSV23 vaccine is not recommended for American Indians, [place] Natives, or people younger than 65 years unless there are medical conditions that require PPSV23 vaccine. When indicated, people who have unknown immunization and have no record of immunization should receive PPSV23 vaccine. One-time revaccination 5 years after the first dose of PPSV23 is recommended for people aged 19-64 years who have chronic kidney failure, nephrotic syndrome, asplenia, or immunocompromised conditions. People who received 1-2 doses of PPSV23 before age 64 years should receive another dose of PPSV23 vaccine at age 53 years or later if at least 5 years have passed since the previous dose. Doses of PPSV23 are not needed for people immunized with PPSV23 at or after age 23 years.  Preventive Services / Frequency   Ages 73 to 11 years  Blood  pressure check.  Lipid and cholesterol check.  Lung cancer screening. / Every year if you are aged 42-80 years and have a 30-pack-year history of smoking and currently smoke or have quit within the past 15 years. Yearly screening is stopped once  you have quit smoking for at least 15 years or develop a health problem that would prevent you from having lung cancer treatment.  Clinical breast exam.** / Every year after age 58 years.  BRCA-related cancer risk assessment.** / For women who have family members with a BRCA-related cancer (breast, ovarian, tubal, or peritoneal cancers).  Mammogram.** / Every year beginning at age 59 years and continuing for as long as you are in good health. Consult with your health care provider.  Pap test.** / Every 3 years starting at age 36 years through age 25 or 70 years with a history of 3 consecutive normal Pap tests.  HPV screening.** / Every 3 years from ages 49 years through ages 57 to 89 years with a history of 3 consecutive normal Pap tests.  Fecal occult blood test (FOBT) of stool. / Every year beginning at age 63 years and continuing until age 63 years. You may not need to do this test if you get a colonoscopy every 10 years.  Flexible sigmoidoscopy or colonoscopy.** / Every 5 years for a flexible sigmoidoscopy or every 10 years for a colonoscopy beginning at age 56 years and continuing until age 18 years.  Hepatitis C blood test.** / For all people born from [birth year] through 1963 and any individual with known risks for hepatitis C.  Skin self-exam. / Monthly.  Influenza vaccine. / Every year.  Tetanus, diphtheria, and acellular pertussis (Tdap/Td) vaccine.** / Consult your health care provider. Pregnant women should receive 1 dose of Tdap vaccine during each pregnancy. 1 dose of Td every 10 years.  Varicella vaccine.** / Consult your health care provider. Pregnant females who do not have evidence of immunity should receive the first dose after  pregnancy.  Zoster vaccine.** / 1 dose for adults aged 74 years or older.  Pneumococcal 13-valent conjugate (PCV13) vaccine.** / Consult your health care provider.  Pneumococcal polysaccharide (PPSV23) vaccine.** / 1 to 2 doses if you smoke cigarettes or if you have certain conditions.  Meningococcal vaccine.** / Consult your health care provider.  Hepatitis A vaccine.** / Consult your health care provider.  Hepatitis B vaccine.** / Consult your health care provider. Screening for abdominal aortic aneurysm (AAA)  by ultrasound is recommended for people over 50 who have history of high blood pressure or who are current or former smokers.

## 2014-12-24 NOTE — Progress Notes (Signed)
Patient ID: Donna Chapman, female   DOB: 09-18-1960, 54 y.o.   MRN: [id number]  Annual Comprehensive Examination  This very nice 54 y.o. MWF presents for complete physical.  Patient has been followed for HTN, T2_NIDDM  Prediabetes, Hyperlipidemia, and Vitamin D Deficiency.    HTN predates since  12/2011. Patient's BP has been controlled at home in the range 110-115/70's  and patient denies any cardiac symptoms as chest pain, palpitations, shortness of breath, dizziness or ankle swelling. Today's BP: 136/88 mmHg    Patient's hyperlipidemia is controlled with diet and medications. Patient denies myalgias or other medication SE's. Last lipids were at goal with borderline elevated Trig. She has had sl elevasted LDL in the past.  Lab Results  Component Value Date   CHOL 164 10/09/2013   HDL 47 10/09/2013   LDLCALC 86 10/09/2013   TRIG 155* 10/09/2013   CHOLHDL 3.5 10/09/2013    Patient has Morbid Obesity (BMI 31.95) with hx/o elevated glucose and is screened for prediabetes  and patient denies reactive hypoglycemic symptoms, visual blurring, diabetic polys, or paresthesias. Last A1c was 5.3% in Apr 2015.      Finally, patient has history of Vitamin D Deficiency of 33 in 2013 and last Vitamin D was 65 in Apr 2015.       Medication Sig  . buPROPion  XL) 150 MG 24 hr tablet Take 1 tablet (150 mg  total) by mouth daily for  mood or concentration  . Cetirizine / ZYRTEC  Take by mouth daily.  Marland Kitchen VITAMIN D  Take 2,000 Units by mouth 2 (two) times daily.  Marland Kitchen dicyclomine  20 MG tablet Take 1 tablet 3 x day if needed for nausea, abdominal cramping or bloating  . estradiol-norethindrone (COMBIPATCH) 0.05-0.14 MG/DAY Place 1 patch onto the skin 2 (two) times a week.  . hctz25 MG tablet Take 1 tablet (25 mg total) by mouth daily. For BP & fluid  . WOMENS Multiple Vitamins-Minerals   Take by mouth daily.  . Osteobioflex  1 daily  . Aleve  2 tabs daily  . Triamcinolone / NASACORT AQ  Place into the nose. 1  spray into nostrils daily-OTC  . vitamin C  500 MG tablet Take 500 mg by mouth daily.  . hydrocortisone (ANUSOL-HC) 25 MG suppository Place 1 suppository (25 mg total) rectally 2 (two) times daily.  Marland Kitchen omeprazole (PRILOSEC) 40 MG capsule Take 1 capsule (40 mg total) by mouth daily.  . sucralfate (CARAFATE) 1 G tablet Take 1 tablet (1 g total) by mouth 4 (four) times daily.   No Known Allergies   Past Medical History  Diagnosis Date  . Hypertension   . Allergy    Health Maintenance  Topic Date Due  . PAP SMEAR  10/02/1980  . MAMMOGRAM  10/02/2012  . COLONOSCOPY  10/02/2012  . INFLUENZA VACCINE  01/11/2015  . TETANUS/TDAP  04/22/2022  . HIV Screening  Completed   Immunization History  Administered Date(s) Administered  . Hepatitis B 06/12/1988  . PPD Test 10/09/2013, 12/24/2014  . Td 04/22/2012   Past Surgical History  Procedure Laterality Date  . Ovarian cyst removal  2001  . Breast surgery Bilateral 2009    Reduction   Family History  Problem Relation Age of Onset  . Hypertension Mother   . Hypertension Father    History  Substance Use Topics  . Smoking status: Never Smoker   . Smokeless tobacco: Not on file  . Alcohol Use: No  ROS Constitutional: Denies fever, chills, weight loss/gain, headaches, insomnia,  night sweats, and change in appetite. Does c/o fatigue. Eyes: Denies redness, blurred vision, diplopia, discharge, itchy, watery eyes.  ENT: Denies discharge, congestion, post nasal drip, epistaxis, sore throat, earache, hearing loss, dental pain, Tinnitus, Vertigo, Sinus pain, snoring.  Cardio: Denies chest pain, palpitations, irregular heartbeat, syncope, dyspnea, diaphoresis, orthopnea, PND, claudication, edema Respiratory: denies cough, dyspnea, DOE, pleurisy, hoarseness, laryngitis, wheezing.  Gastrointestinal: Denies dysphagia, heartburn, reflux, water brash, pain, cramps, nausea, vomiting, bloating, diarrhea, constipation, hematemesis, melena,  hematochezia, jaundice, hemorrhoids Genitourinary: Denies dysuria, frequency, urgency, nocturia, hesitancy, discharge, hematuria, flank pain Breast: Breast lumps, nipple discharge, bleeding.  Musculoskeletal: Denies arthralgia, myalgia, stiffness, Jt. Swelling, pain, limp, and strain/sprain. Denies falls. Skin: Denies puritis, rash, hives, warts, acne, eczema, changing in skin lesion Neuro: No weakness, tremor, incoordination, spasms, paresthesia, pain Psychiatric: Denies confusion, memory loss, sensory loss. Denies Depression. Endocrine: Denies change in weight, skin, hair change, nocturia, and paresthesia, diabetic polys, visual blurring, hyper / hypo glycemic episodes.  Heme/Lymph: No excessive bleeding, bruising, enlarged lymph nodes.  Physical Exam  BP 136/88   Pulse 72  Temp 97.3 F   Resp 16  Ht 5' 3.75"   Wt 184 lb 9.6 oz     BMI 31.95   General Appearance: Well nourished and in no apparent distress. Eyes: PERRLA, EOMs, conjunctiva no swelling or erythema, normal fundi and vessels. Sinuses: No frontal/maxillary tenderness ENT/Mouth: EACs patent / TMs  nl. Nares clear without erythema, swelling, mucoid exudates. Oral hygiene is good. No erythema, swelling, or exudate. Tongue normal, non-obstructing. Tonsils not swollen or erythematous. Hearing normal.  Neck: Supple, thyroid normal. No bruits, nodes or JVD. Respiratory: Respiratory effort normal.  BS equal and clear bilateral without rales, rhonci, wheezing or stridor. Cardio: Heart sounds are normal with regular rate and rhythm and no murmurs, rubs or gallops. Peripheral pulses are normal and equal bilaterally without edema. No aortic or femoral bruits. Chest: symmetric with normal excursions and percussion. Breasts: Symmetric, without lumps, nipple discharge, retractions, or fibrocystic changes.  Abdomen: Flat, soft, with bowel sounds. Nontender, no guarding, rebound, hernias, masses, or organomegaly.  Lymphatics: Non tender  without lymphadenopathy.  Genitourinary:  Musculoskeletal: Full ROM all peripheral extremities, joint stability, 5/5 strength, and normal gait. Skin: Warm and dry without rashes, lesions, cyanosis, clubbing or  ecchymosis.  Neuro: Cranial nerves intact, reflexes equal bilaterally. Normal muscle tone, no cerebellar symptoms. Sensation intact.  Pysch: Awake and oriented X 3, normal affect, Insight and Judgment appropriate.   Assessment and Plan  1. Essential hypertension  - Microalbumin / creatinine urine ratio - EKG 12-Lead - [place], RETROPERITNL ABD,  LTD - TSH  2. Mixed hyperlipidemia  - Lipid panel  3. Abnormal glucose  - Hemoglobin A1c - Insulin, random  4. Vitamin D deficiency  - Vit D  25 hydroxy   5. Screening for rectal cancer  - POC Hemoccult Bld/Stl   6. Other fatigue  - Vitamin B12 - Iron and TIBC  7. BMI 31.0-31.9,adult   8. Medication management  - Urine Microscopic - CBC with Differential/Platelet - BASIC METABOLIC PANEL WITH GFR - Hepatic function panel - Magnesium  9. Screening examination for pulmonary tuberculosis  - PPD   Continue prudent diet as discussed, weight control, BP monitoring, regular exercise, and medications. Discussed med's effects and SE's. Screening labs and tests as requested with regular follow-up as recommended.  Over 40 minutes of exam, counseling, chart review was performed.

## 2014-12-25 LAB — HEPATIC FUNCTION PANEL
ALT: 13 U/L (ref 0–35)
AST: 18 U/L (ref 0–37)
Albumin: 3.8 g/dL (ref 3.5–5.2)
Alkaline Phosphatase: 68 U/L (ref 39–117)
BILIRUBIN DIRECT: 0.1 mg/dL (ref 0.0–0.3)
Indirect Bilirubin: 0.3 mg/dL (ref 0.2–1.2)
Total Bilirubin: 0.4 mg/dL (ref 0.2–1.2)
Total Protein: 6 g/dL (ref 6.0–8.3)

## 2014-12-25 LAB — BASIC METABOLIC PANEL WITH GFR
BUN: 14 mg/dL (ref 6–23)
CO2: 26 mEq/L (ref 19–32)
CREATININE: 0.7 mg/dL (ref 0.50–1.10)
Calcium: 9.4 mg/dL (ref 8.4–10.5)
Chloride: 104 mEq/L (ref 96–112)
GFR, Est African American: >89 mL/min
GFR, Est Non African American: >89 mL/min
GLUCOSE: 72 mg/dL (ref 70–99)
POTASSIUM: 3.9 meq/L (ref 3.5–5.3)
Sodium: 145 mEq/L (ref 135–145)

## 2014-12-25 LAB — URINALYSIS, MICROSCOPIC ONLY
BACTERIA UA: NONE SEEN
Casts: NONE SEEN
Crystals: NONE SEEN
Squamous Epithelial / LPF: NONE SEEN

## 2014-12-25 LAB — LIPID PANEL
Cholesterol: 144 mg/dL (ref 0–200)
HDL: 34 mg/dL — AB (ref >=46)
LDL CALC: 47 mg/dL (ref 0–99)
Total CHOL/HDL Ratio: 4.2 Ratio
Triglycerides: 316 mg/dL — ABNORMAL HIGH (ref <150)
VLDL: 63 mg/dL — ABNORMAL HIGH (ref 0–40)

## 2014-12-25 LAB — INSULIN, RANDOM: Insulin: 5.8 u[IU]/mL (ref 2.0–19.6)

## 2014-12-25 LAB — MICROALBUMIN / CREATININE URINE RATIO
Creatinine, Urine: 50.4 mg/dL
MICROALB UR: 0.3 mg/dL (ref <2.0)
MICROALB/CREAT RATIO: 6 mg/g (ref 0.0–30.0)

## 2014-12-25 LAB — IRON AND TIBC
%SAT: 9 % — ABNORMAL LOW (ref 20–55)
Iron: 25 ug/dL — ABNORMAL LOW (ref 42–145)
TIBC: 269 ug/dL (ref 250–470)
UIBC: 244 ug/dL (ref 125–400)

## 2014-12-25 LAB — VITAMIN D 25 HYDROXY (VIT D DEFICIENCY, FRACTURES): VIT D 25 HYDROXY: 47 ng/mL (ref 30–100)

## 2014-12-25 LAB — MAGNESIUM: MAGNESIUM: 1.9 mg/dL (ref 1.5–2.5)

## 2014-12-25 LAB — HEMOGLOBIN A1C
Hgb A1c MFr Bld: 5.3 % (ref <5.7)
Mean Plasma Glucose: 105 mg/dL (ref <117)

## 2014-12-28 ENCOUNTER — Encounter: Payer: Self-pay | Admitting: Internal Medicine

## 2015-01-19 ENCOUNTER — Other Ambulatory Visit: Payer: Self-pay | Admitting: *Deleted

## 2015-01-19 ENCOUNTER — Other Ambulatory Visit: Payer: Self-pay | Admitting: Internal Medicine

## 2015-01-19 DIAGNOSIS — Z1212 Encounter for screening for malignant neoplasm of rectum: Secondary | ICD-10-CM

## 2015-01-19 LAB — POC HEMOCCULT BLD/STL (HOME/3-CARD/SCREEN)
Card #3 Fecal Occult Blood, POC: NEGATIVE
FECAL OCCULT BLD: NEGATIVE
Fecal Occult Blood, POC: NEGATIVE

## 2015-03-14 ENCOUNTER — Other Ambulatory Visit: Payer: Self-pay | Admitting: Internal Medicine

## 2015-03-30 ENCOUNTER — Other Ambulatory Visit: Payer: Self-pay

## 2015-03-30 DIAGNOSIS — Z9889 Other specified postprocedural states: Secondary | ICD-10-CM

## 2015-03-30 DIAGNOSIS — Z1231 Encounter for screening mammogram for malignant neoplasm of breast: Secondary | ICD-10-CM

## 2015-04-21 ENCOUNTER — Ambulatory Visit
Admission: RE | Admit: 2015-04-21 | Discharge: 2015-04-21 | Disposition: A | Discharge status: Home / Self Care | Payer: 59 | Source: Ambulatory Visit

## 2015-04-21 DIAGNOSIS — Z1231 Encounter for screening mammogram for malignant neoplasm of breast: Secondary | ICD-10-CM

## 2015-04-21 DIAGNOSIS — Z9889 Other specified postprocedural states: Secondary | ICD-10-CM

## 2015-05-10 ENCOUNTER — Other Ambulatory Visit: Payer: Self-pay | Admitting: Internal Medicine

## 2015-05-10 DIAGNOSIS — N951 Menopausal and female climacteric states: Secondary | ICD-10-CM

## 2015-05-10 MED ORDER — ESTRADIOL-NORETHINDRONE ACET 0.05-0.14 MG/DAY TD PTTW: 1.0000 | MEDICATED_PATCH | TRANSDERMAL | Status: DC

## 2015-06-09 ENCOUNTER — Other Ambulatory Visit: Payer: Self-pay | Admitting: Internal Medicine

## 2015-06-09 ENCOUNTER — Encounter: Payer: Self-pay | Admitting: Internal Medicine

## 2015-06-09 DIAGNOSIS — N951 Menopausal and female climacteric states: Secondary | ICD-10-CM

## 2015-06-09 MED ORDER — NORETHINDRONE-ETH ESTRADIOL 1-5 MG-MCG PO TABS: ORAL_TABLET | ORAL | Status: DC

## 2015-07-01 ENCOUNTER — Ambulatory Visit: Payer: Self-pay | Admitting: Internal Medicine

## 2015-07-03 ENCOUNTER — Other Ambulatory Visit: Payer: Self-pay | Admitting: Physician Assistant

## 2015-07-22 ENCOUNTER — Ambulatory Visit: Payer: Self-pay | Admitting: Internal Medicine

## 2015-08-05 ENCOUNTER — Other Ambulatory Visit: Payer: Self-pay | Admitting: Internal Medicine

## 2015-08-05 ENCOUNTER — Encounter: Payer: Self-pay | Admitting: Internal Medicine

## 2015-08-05 DIAGNOSIS — J0140 Acute pansinusitis, unspecified: Secondary | ICD-10-CM

## 2015-08-05 MED ORDER — LEVOFLOXACIN 500 MG PO TABS: ORAL_TABLET | ORAL | Status: AC

## 2015-08-05 MED ORDER — PREDNISONE 20 MG PO TABS: ORAL_TABLET | ORAL | Status: DC

## 2015-10-12 ENCOUNTER — Other Ambulatory Visit: Payer: Self-pay | Admitting: Internal Medicine

## 2015-10-22 ENCOUNTER — Encounter: Payer: Self-pay | Admitting: Internal Medicine

## 2015-10-22 ENCOUNTER — Other Ambulatory Visit: Payer: Self-pay | Admitting: Internal Medicine

## 2015-10-22 MED ORDER — PREDNISONE 20 MG PO TABS: ORAL_TABLET | ORAL | Status: DC

## 2015-10-22 MED ORDER — LEVOFLOXACIN 500 MG PO TABS: ORAL_TABLET | ORAL | Status: DC

## 2015-12-26 ENCOUNTER — Other Ambulatory Visit: Payer: Self-pay | Admitting: Internal Medicine

## 2016-01-18 ENCOUNTER — Encounter: Payer: Self-pay | Admitting: Internal Medicine

## 2016-03-09 ENCOUNTER — Other Ambulatory Visit: Payer: Self-pay | Admitting: Internal Medicine

## 2016-03-09 ENCOUNTER — Ambulatory Visit (INDEPENDENT_AMBULATORY_CARE_PROVIDER_SITE_OTHER): Payer: Commercial Managed Care - HMO | Admitting: Internal Medicine

## 2016-03-09 VITALS — BP 138/100 | HR 80 | Temp 97.7°F | Resp 16 | Ht 63.5 in | Wt 174.2 lb

## 2016-03-09 DIAGNOSIS — E782 Mixed hyperlipidemia: Secondary | ICD-10-CM

## 2016-03-09 DIAGNOSIS — Z79899 Other long term (current) drug therapy: Secondary | ICD-10-CM

## 2016-03-09 DIAGNOSIS — Z0001 Encounter for general adult medical examination with abnormal findings: Secondary | ICD-10-CM

## 2016-03-09 DIAGNOSIS — R5383 Other fatigue: Secondary | ICD-10-CM

## 2016-03-09 DIAGNOSIS — I10 Essential (primary) hypertension: Secondary | ICD-10-CM | POA: Diagnosis not present

## 2016-03-09 DIAGNOSIS — Z1212 Encounter for screening for malignant neoplasm of rectum: Secondary | ICD-10-CM

## 2016-03-09 DIAGNOSIS — Z136 Encounter for screening for cardiovascular disorders: Secondary | ICD-10-CM

## 2016-03-09 DIAGNOSIS — Z0000 Encounter for general adult medical examination without abnormal findings: Secondary | ICD-10-CM | POA: Diagnosis not present

## 2016-03-09 DIAGNOSIS — E559 Vitamin D deficiency, unspecified: Secondary | ICD-10-CM

## 2016-03-09 DIAGNOSIS — Z111 Encounter for screening for respiratory tuberculosis: Secondary | ICD-10-CM | POA: Diagnosis not present

## 2016-03-09 DIAGNOSIS — Z1231 Encounter for screening mammogram for malignant neoplasm of breast: Secondary | ICD-10-CM

## 2016-03-09 DIAGNOSIS — R7309 Other abnormal glucose: Secondary | ICD-10-CM

## 2016-03-09 LAB — BASIC METABOLIC PANEL WITH GFR
BUN: 14 mg/dL (ref 7–25)
CO2: 25 mmol/L (ref 20–31)
Calcium: 9.4 mg/dL (ref 8.6–10.4)
Chloride: 105 mmol/L (ref 98–110)
Creat: 0.67 mg/dL (ref 0.50–1.05)
GFR, Est African American: >89 mL/min (ref >=60)
GFR, Est Non African American: >89 mL/min (ref >=60)
GLUCOSE: 83 mg/dL (ref 65–99)
POTASSIUM: 3.6 mmol/L (ref 3.5–5.3)
Sodium: 142 mmol/L (ref 135–146)

## 2016-03-09 LAB — IRON AND TIBC
%SAT: 11 % (ref 11–50)
Iron: 32 ug/dL — ABNORMAL LOW (ref 45–160)
TIBC: 293 ug/dL (ref 250–450)
UIBC: 261 ug/dL (ref 125–400)

## 2016-03-09 LAB — HEPATIC FUNCTION PANEL
ALK PHOS: 66 U/L (ref 33–130)
ALT: 15 U/L (ref 6–29)
AST: 21 U/L (ref 10–35)
Albumin: 4.2 g/dL (ref 3.6–5.1)
BILIRUBIN TOTAL: 0.3 mg/dL (ref 0.2–1.2)
Bilirubin, Direct: 0.1 mg/dL (ref <=0.2)
Indirect Bilirubin: 0.2 mg/dL (ref 0.2–1.2)
Total Protein: 6.4 g/dL (ref 6.1–8.1)

## 2016-03-09 LAB — CBC WITH DIFFERENTIAL/PLATELET
BASOS ABS: 104 {cells}/uL (ref 0–200)
BASOS PCT: 1 %
EOS ABS: 624 {cells}/uL — AB (ref 15–500)
Eosinophils Relative: 6 %
HEMATOCRIT: 44 % (ref 35.0–45.0)
HEMOGLOBIN: 15.1 g/dL (ref 11.7–15.5)
Lymphocytes Relative: 37 %
Lymphs Abs: 3848 cells/uL (ref 850–3900)
MCH: 31.3 pg (ref 27.0–33.0)
MCHC: 34.3 g/dL (ref 32.0–36.0)
MCV: 91.3 fL (ref 80.0–100.0)
MONO ABS: 624 {cells}/uL (ref 200–950)
MPV: 11.5 fL (ref 7.5–12.5)
Monocytes Relative: 6 %
NEUTROS ABS: 5200 {cells}/uL (ref 1500–7800)
Neutrophils Relative %: 50 %
Platelets: 202 10*3/uL (ref 140–400)
RBC: 4.82 MIL/uL (ref 3.80–5.10)
RDW: 13.7 % (ref 11.0–15.0)
WBC: 10.4 10*3/uL (ref 3.8–10.8)

## 2016-03-09 LAB — LIPID PANEL
Cholesterol: 130 mg/dL (ref 125–200)
HDL: 36 mg/dL — AB (ref >=46)
LDL Cholesterol: 70 mg/dL (ref <130)
Total CHOL/HDL Ratio: 3.6 Ratio (ref <=5.0)
Triglycerides: 121 mg/dL (ref <150)
VLDL: 24 mg/dL (ref <30)

## 2016-03-09 LAB — MAGNESIUM: Magnesium: 2 mg/dL (ref 1.5–2.5)

## 2016-03-09 NOTE — Patient Instructions (Signed)

## 2016-03-09 NOTE — Progress Notes (Signed)
[facility] Unk Pinto, M.D.    Uvaldo Bristle. Silverio Lay, P.A.-C      Starlyn Skeans, P.A.-C  [facility]                [address] [place], [place] [id number] Telephone [phone] Telefax [phone]  Annual Screening/Preventative Visit & Comprehensive Evaluation &  Examination     This very nice 55 y.o. MWF presents for a Screening/Preventative Visit & comprehensive evaluation and management of multiple medical co-morbidities.  Patient has been followed for HTN, Prediabetes Screening, Hyperlipidemia Screening and Vitamin D Deficiency. Patient has GERD with sx's controlled with diet & medications.       HTN predates circa 2010. Patient's BP has not recently been monitored at home and patient denies any cardiac symptoms as chest pain, palpitations, shortness of breath, dizziness or ankle swelling. Today's BP is elevated at 138/100 and confirmed at 166/100.      Patient's hyperlipidemia is controlled with diet. Last lipids were at goal: Lab Results  Component Value Date   CHOL 130 03/09/2016   HDL 36 (L) 03/09/2016   LDLCALC 70 03/09/2016   TRIG 121 03/09/2016   CHOLHDL 3.6 03/09/2016      Patient has Morbid Obesity (BMI 30+) and is proactively screened for  prediabetes  and patient denies reactive hypoglycemic symptoms, visual blurring, diabetic polys, or paresthesias. Last A1c was at goal: Lab Results  Component Value Date   HGBA1C 4.9 03/09/2016      Finally, patient has history of Vitamin D Deficiency of "44" 0n replacement in 2009 and last Vitamin D was at goal: Lab Results  Component Value Date   VD25OH 68 03/09/2016   Current Outpatient Prescriptions on File Prior to Visit  Medication Sig  . WELLBUTRIN XL 150 MG  TAKE 1 TABLET BY MOUTH  DAILY   . Cetirizine 10 mg Take by mouth daily.  Marland Kitchen VITAMIN D Take 2,000 Units by mouth 2 (two) times daily.  Marland Kitchen dicyclomine20 MG tablet Take 1 tablet 3  x day if needed   . COMBIPATCH 0.05-0.14  Place 1 patch onto the skin 2 (two) times a week.  . hctz 25 MG tablet Take 1 tablet by mouth daily   . Multi-Vit w/Min  Take by mouth daily.  . FEMHRT  1-5 MG-MCG TABS t Take 1 tablet daily  . omeprazole (PRILOSEC) 40 MG capsule TK 1 C PO D  . Osteobioflex  1 daily  . Aleve  2 tabs daily  . sucralfate  1 G tablet TK 1 T PO QID  . NASACORT AQ  Place into the nose. 1 spray into nostrils daily-OTC  . vitamin C 00 MG tablet Take 500 mg by mouth daily.   No Known Allergies   Past Medical History:  Diagnosis Date  . Allergy   . Hypertension    Health Maintenance  Topic Date Due  . PAP SMEAR  10/03/1983  . COLONOSCOPY  10/02/2012  . INFLUENZA VACCINE  01/11/2016  . MAMMOGRAM  04/20/2017  . TETANUS/TDAP  04/22/2022  . Hepatitis C Screening  Completed  . HIV Screening  Completed   Immunization History  Administered Date(s) Administered  . Hepatitis B 06/12/1988  . PPD Test 10/09/2013, 12/24/2014, 03/09/2016  . Td 04/22/2012   Past Surgical History:  Procedure Laterality Date  . BREAST SURGERY Bilateral 2009   Reduction  .  OVARIAN CYST REMOVAL  2001   Family History  Problem Relation Age of Onset  . Hypertension Mother   . Hypertension Father    Social History  Substance Use Topics  . Smoking status: Never Smoker  . Smokeless tobacco: Not on file  . Alcohol use No    ROS Constitutional: Denies fever, chills, weight loss/gain, headaches, insomnia,  night sweats, and change in appetite. Does c/o fatigue. Eyes: Denies redness, blurred vision, diplopia, discharge, itchy, watery eyes.  ENT: Denies discharge, congestion, post nasal drip, epistaxis, sore throat, earache, hearing loss, dental pain, Tinnitus, Vertigo, Sinus pain, snoring.  Cardio: Denies chest pain, palpitations, irregular heartbeat, syncope, dyspnea, diaphoresis, orthopnea, PND, claudication, edema Respiratory: denies cough, dyspnea, DOE, pleurisy, hoarseness,  laryngitis, wheezing.  Gastrointestinal: Denies dysphagia, heartburn, reflux, water brash, pain, cramps, nausea, vomiting, bloating, diarrhea, constipation, hematemesis, melena, hematochezia, jaundice, hemorrhoids Genitourinary: Denies dysuria, frequency, urgency, nocturia, hesitancy, discharge, hematuria, flank pain Breast: Breast lumps, nipple discharge, bleeding.  Musculoskeletal: Denies arthralgia, myalgia, stiffness, Jt. Swelling, pain, limp, and strain/sprain. Denies falls. Skin: Denies puritis, rash, hives, warts, acne, eczema, changing in skin lesion Neuro: No weakness, tremor, incoordination, spasms, paresthesia, pain Psychiatric: Denies confusion, memory loss, sensory loss. Denies Depression. Endocrine: Denies change in weight, skin, hair change, nocturia, and paresthesia, diabetic polys, visual blurring, hyper / hypo glycemic episodes.  Heme/Lymph: No excessive bleeding, bruising, enlarged lymph nodes.  Physical Exam  BP (!) 138/100   Pulse 80   Temp 97.7 F (36.5 C)   Resp 16   Ht 5' 3.5" (1.613 m)   Wt 174 lb 3.2 oz (79 kg)   BMI 30.37 kg/m   General Appearance: Well nourished and in no apparent distress.  Eyes: PERRLA, EOMs, conjunctiva no swelling or erythema, normal fundi and vessels. Sinuses: No frontal/maxillary tenderness ENT/Mouth: EACs patent / TMs  nl. Nares clear without erythema, swelling, mucoid exudates. Oral hygiene is good. No erythema, swelling, or exudate. Tongue normal, non-obstructing. Tonsils not swollen or erythematous. Hearing normal.  Neck: Supple, thyroid normal. No bruits, nodes or JVD. Respiratory: Respiratory effort normal.  BS equal and clear bilateral without rales, rhonci, wheezing or stridor. Cardio: Heart sounds are normal with regular rate and rhythm and no murmurs, rubs or gallops. Peripheral pulses are normal and equal bilaterally without edema. No aortic or femoral bruits. Chest: symmetric with normal excursions and percussion. Breasts:  Symmetric, without lumps, nipple discharge, retractions, or fibrocystic changes.  Abdomen: Flat, soft with bowel sounds active. Nontender, no guarding, rebound, hernias, masses, or organomegaly.  Lymphatics: Non tender without lymphadenopathy.  Genitourinary:  Musculoskeletal: Full ROM all peripheral extremities, joint stability, 5/5 strength, and normal gait. Skin: Warm and dry without rashes, lesions, cyanosis, clubbing or  ecchymosis.  Neuro: Cranial nerves intact, reflexes equal bilaterally. Normal muscle tone, no cerebellar symptoms. Sensation intact.  Pysch: Alert and oriented X 3, normal affect, Insight and Judgment appropriate.   Assessment and Plan  1. Annual Preventative Screening Examination  - Advised monitor bid BP's for 1 week & call to office - Microalbumin / creatinine urine ratio - EKG 12-Lead - POC Hemoccult Bld/Stl  - Urinalysis, Routine w reflex microscopic  - Vitamin B12 - Iron and TIBC - CBC with Differential/Platelet - BASIC METABOLIC PANEL WITH GFR - Hepatic function panel - Magnesium - Lipid panel - TSH - Hemoglobin A1c - Insulin, random - VITAMIN D 25 Hydroxy   2. Essential hypertension  - Microalbumin / creatinine urine ratio - EKG 12-Lead - TSH  3. Mixed hyperlipidemia  -  EKG 12-Lead - Lipid panel - TSH  4. Abnormal glucose  - EKG 12-Lead - Hemoglobin A1c - Insulin, random  5. Vitamin D deficiency  - VITAMIN D 25 Hydroxy   6. Screening for rectal cancer  - POC Hemoccult Bld/Stl   7. Other fatigue  - Vitamin B12 - Iron and TIBC - CBC with Differential/Platelet  8. Screening examination for pulmonary tuberculosis  - PPD  9. Screening for ischemic heart disease  - EKG 12-Lead  10. Medication management  - Urinalysis, Routine w reflex microscopic  - CBC with Differential/Platelet - BASIC METABOLIC PANEL WITH GFR - Hepatic function panel - Magnesium      Continue prudent diet as discussed, weight control, BP  monitoring, regular exercise, and medications. Discussed med's effects and SE's. Screening labs and tests as requested with regular follow-up as recommended. Over 40 minutes of exam, counseling, chart review and high complex critical decision making was performed.

## 2016-03-10 LAB — TSH: TSH: 1.39 mIU/L

## 2016-03-10 LAB — URINALYSIS, ROUTINE W REFLEX MICROSCOPIC
Bilirubin Urine: NEGATIVE
GLUCOSE, UA: NEGATIVE
HGB URINE DIPSTICK: NEGATIVE
Ketones, ur: NEGATIVE
LEUKOCYTES UA: NEGATIVE
Nitrite: NEGATIVE
PROTEIN: NEGATIVE
Specific Gravity, Urine: 1.009 (ref 1.001–1.035)
pH: 6.5 (ref 5.0–8.0)

## 2016-03-10 LAB — HEMOGLOBIN A1C
Hgb A1c MFr Bld: 4.9 % (ref <5.7)
Mean Plasma Glucose: 94 mg/dL

## 2016-03-10 LAB — VITAMIN B12: VITAMIN B 12: 608 pg/mL (ref 200–1100)

## 2016-03-10 LAB — MICROALBUMIN / CREATININE URINE RATIO
Creatinine, Urine: 55 mg/dL (ref 20–320)
MICROALB UR: 0.3 mg/dL
MICROALB/CREAT RATIO: 5 ug/mg{creat} (ref <30)

## 2016-03-10 LAB — VITAMIN D 25 HYDROXY (VIT D DEFICIENCY, FRACTURES): Vit D, 25-Hydroxy: 68 ng/mL (ref 30–100)

## 2016-03-10 LAB — INSULIN, RANDOM: Insulin: 4.8 u[IU]/mL (ref 2.0–19.6)

## 2016-03-11 ENCOUNTER — Encounter: Payer: Self-pay | Admitting: Internal Medicine

## 2016-03-12 ENCOUNTER — Encounter: Payer: Self-pay | Admitting: Internal Medicine

## 2016-03-13 LAB — TB SKIN TEST
INDURATION: 0 mm
TB Skin Test: NEGATIVE

## 2016-03-14 ENCOUNTER — Encounter: Payer: Self-pay | Admitting: Internal Medicine

## 2016-03-16 ENCOUNTER — Other Ambulatory Visit: Payer: Self-pay | Admitting: Internal Medicine

## 2016-03-16 ENCOUNTER — Telehealth: Payer: Self-pay | Admitting: *Deleted

## 2016-03-16 DIAGNOSIS — I10 Essential (primary) hypertension: Secondary | ICD-10-CM

## 2016-03-16 MED ORDER — BISOPROLOL-HYDROCHLOROTHIAZIDE 5-6.25 MG PO TABS: ORAL_TABLET | ORAL | 1 refills | Status: DC

## 2016-03-16 NOTE — Telephone Encounter (Signed)
The patient faxed a list of her BP readings that were elevated at times.  She requested an RX.  Per Dr Leverne Humbles, an RX for Ziac 5 mg was sent to [facility] and the patient was informed to stop her HCTZ 25 mg since Ziac contains some HCTZ. The patient was advised to call back if she has questions.

## 2016-03-23 ENCOUNTER — Other Ambulatory Visit: Payer: Self-pay | Admitting: *Deleted

## 2016-03-23 DIAGNOSIS — Z1212 Encounter for screening for malignant neoplasm of rectum: Secondary | ICD-10-CM

## 2016-03-23 DIAGNOSIS — Z0001 Encounter for general adult medical examination with abnormal findings: Secondary | ICD-10-CM

## 2016-03-23 LAB — POC HEMOCCULT BLD/STL (HOME/3-CARD/SCREEN)
Card #3 Fecal Occult Blood, POC: NEGATIVE
FECAL OCCULT BLD: NEGATIVE
Fecal Occult Blood, POC: NEGATIVE

## 2016-03-28 ENCOUNTER — Other Ambulatory Visit: Payer: Self-pay

## 2016-03-28 DIAGNOSIS — I10 Essential (primary) hypertension: Secondary | ICD-10-CM

## 2016-03-28 MED ORDER — BISOPROLOL-HYDROCHLOROTHIAZIDE 5-6.25 MG PO TABS: ORAL_TABLET | ORAL | 1 refills | Status: DC

## 2016-03-29 ENCOUNTER — Encounter: Payer: Self-pay | Admitting: Internal Medicine

## 2016-04-12 ENCOUNTER — Other Ambulatory Visit: Payer: Self-pay | Admitting: Internal Medicine

## 2016-04-12 DIAGNOSIS — N951 Menopausal and female climacteric states: Secondary | ICD-10-CM

## 2016-04-13 ENCOUNTER — Other Ambulatory Visit: Payer: Self-pay | Admitting: Internal Medicine

## 2016-04-21 ENCOUNTER — Ambulatory Visit
Admission: RE | Admit: 2016-04-21 | Discharge: 2016-04-21 | Disposition: A | Discharge status: Home / Self Care | Payer: 59 | Source: Ambulatory Visit | Attending: Internal Medicine | Admitting: Internal Medicine

## 2016-04-21 DIAGNOSIS — Z1231 Encounter for screening mammogram for malignant neoplasm of breast: Secondary | ICD-10-CM

## 2016-05-08 ENCOUNTER — Other Ambulatory Visit: Payer: Self-pay | Admitting: *Deleted

## 2016-05-08 ENCOUNTER — Encounter: Payer: Self-pay | Admitting: Internal Medicine

## 2016-05-08 MED ORDER — BUPROPION HCL ER (XL) 150 MG PO TB24: ORAL_TABLET | ORAL | 0 refills | Status: DC

## 2016-05-18 ENCOUNTER — Other Ambulatory Visit: Payer: Self-pay | Admitting: Internal Medicine

## 2016-05-18 DIAGNOSIS — K589 Irritable bowel syndrome without diarrhea: Secondary | ICD-10-CM

## 2016-07-11 ENCOUNTER — Encounter: Payer: Self-pay | Admitting: Internal Medicine

## 2016-08-09 ENCOUNTER — Other Ambulatory Visit: Payer: Self-pay | Admitting: Physician Assistant

## 2016-08-09 DIAGNOSIS — I10 Essential (primary) hypertension: Secondary | ICD-10-CM

## 2016-08-18 ENCOUNTER — Other Ambulatory Visit: Payer: Self-pay | Admitting: Internal Medicine

## 2016-08-18 ENCOUNTER — Encounter: Payer: Self-pay | Admitting: Internal Medicine

## 2016-08-20 ENCOUNTER — Other Ambulatory Visit: Payer: Self-pay | Admitting: Internal Medicine

## 2016-08-20 MED ORDER — PHENTERMINE HCL 37.5 MG PO TABS: ORAL_TABLET | ORAL | 5 refills | Status: DC

## 2016-10-25 ENCOUNTER — Other Ambulatory Visit: Payer: Self-pay | Admitting: Internal Medicine

## 2016-10-25 ENCOUNTER — Encounter: Payer: Self-pay | Admitting: Internal Medicine

## 2016-10-25 MED ORDER — MONTELUKAST SODIUM 10 MG PO TABS: ORAL_TABLET | ORAL | 3 refills | Status: DC

## 2016-10-25 MED ORDER — PREDNISONE 20 MG PO TABS: ORAL_TABLET | ORAL | 0 refills | Status: DC

## 2016-11-10 ENCOUNTER — Other Ambulatory Visit: Payer: Self-pay | Admitting: Internal Medicine

## 2016-11-30 DIAGNOSIS — Z0100 Encounter for examination of eyes and vision without abnormal findings: Secondary | ICD-10-CM | POA: Diagnosis not present

## 2017-04-07 ENCOUNTER — Other Ambulatory Visit: Payer: Self-pay | Admitting: Internal Medicine

## 2017-04-07 DIAGNOSIS — N951 Menopausal and female climacteric states: Secondary | ICD-10-CM

## 2017-04-07 DIAGNOSIS — I10 Essential (primary) hypertension: Secondary | ICD-10-CM

## 2017-04-07 DIAGNOSIS — K589 Irritable bowel syndrome without diarrhea: Secondary | ICD-10-CM

## 2017-05-02 ENCOUNTER — Encounter: Payer: Self-pay | Admitting: Internal Medicine

## 2017-06-08 ENCOUNTER — Other Ambulatory Visit: Payer: Self-pay | Admitting: Internal Medicine

## 2017-06-14 ENCOUNTER — Other Ambulatory Visit: Payer: Self-pay | Admitting: Internal Medicine

## 2017-06-14 DIAGNOSIS — Z1231 Encounter for screening mammogram for malignant neoplasm of breast: Secondary | ICD-10-CM

## 2017-07-06 ENCOUNTER — Ambulatory Visit
Admission: RE | Admit: 2017-07-06 | Discharge: 2017-07-06 | Disposition: A | Discharge status: Home / Self Care | Payer: 59 | Source: Ambulatory Visit | Attending: Internal Medicine | Admitting: Internal Medicine

## 2017-07-06 DIAGNOSIS — Z1231 Encounter for screening mammogram for malignant neoplasm of breast: Secondary | ICD-10-CM | POA: Diagnosis not present

## 2017-07-06 LAB — HM PAP SMEAR: HM Pap smear: NORMAL

## 2017-07-06 LAB — HM MAMMOGRAPHY

## 2017-07-26 DIAGNOSIS — Z6831 Body mass index (BMI) 31.0-31.9, adult: Secondary | ICD-10-CM | POA: Diagnosis not present

## 2017-07-26 DIAGNOSIS — Z01419 Encounter for gynecological examination (general) (routine) without abnormal findings: Secondary | ICD-10-CM | POA: Diagnosis not present

## 2017-08-09 ENCOUNTER — Ambulatory Visit: Payer: 59 | Admitting: Internal Medicine

## 2017-08-09 ENCOUNTER — Encounter: Payer: Self-pay | Admitting: Internal Medicine

## 2017-08-09 VITALS — BP 110/80 | HR 64 | Temp 97.6°F | Resp 16 | Ht 63.5 in | Wt 176.2 lb

## 2017-08-09 DIAGNOSIS — Z79899 Other long term (current) drug therapy: Secondary | ICD-10-CM

## 2017-08-09 DIAGNOSIS — Z0000 Encounter for general adult medical examination without abnormal findings: Secondary | ICD-10-CM | POA: Diagnosis not present

## 2017-08-09 DIAGNOSIS — R5383 Other fatigue: Secondary | ICD-10-CM

## 2017-08-09 DIAGNOSIS — Z1212 Encounter for screening for malignant neoplasm of rectum: Secondary | ICD-10-CM

## 2017-08-09 DIAGNOSIS — R7309 Other abnormal glucose: Secondary | ICD-10-CM

## 2017-08-09 DIAGNOSIS — I10 Essential (primary) hypertension: Secondary | ICD-10-CM | POA: Diagnosis not present

## 2017-08-09 DIAGNOSIS — Z136 Encounter for screening for cardiovascular disorders: Secondary | ICD-10-CM

## 2017-08-09 DIAGNOSIS — Z0001 Encounter for general adult medical examination with abnormal findings: Secondary | ICD-10-CM

## 2017-08-09 DIAGNOSIS — Z1211 Encounter for screening for malignant neoplasm of colon: Secondary | ICD-10-CM

## 2017-08-09 DIAGNOSIS — Z111 Encounter for screening for respiratory tuberculosis: Secondary | ICD-10-CM | POA: Diagnosis not present

## 2017-08-09 DIAGNOSIS — E559 Vitamin D deficiency, unspecified: Secondary | ICD-10-CM

## 2017-08-09 DIAGNOSIS — E782 Mixed hyperlipidemia: Secondary | ICD-10-CM

## 2017-08-09 DIAGNOSIS — K219 Gastro-esophageal reflux disease without esophagitis: Secondary | ICD-10-CM

## 2017-08-09 DIAGNOSIS — Z8249 Family history of ischemic heart disease and other diseases of the circulatory system: Secondary | ICD-10-CM

## 2017-08-09 NOTE — Progress Notes (Signed)
[facility] Unk Pinto, M.D.     Uvaldo Bristle. Silverio Lay, P.A.-C Liane Comber, [place] [address] [place], [place] [address] Telephone [phone] Telefax [phone] Annual Screening/Preventative Visit & Comprehensive Evaluation &  Examination     This very nice 57 y.o.  MWF presents for a Screening/Preventative Visit & comprehensive evaluation and management of multiple medical co-morbidities.  Patient has been followed for HTN, HLD, Prediabetes  and Vitamin D Deficiency.      HTN predates since 2010. Patient's BP has been controlled at home and patient denies any cardiac symptoms as chest pain, palpitations, shortness of breath, dizziness or ankle swelling. Today's BP is at goal: 110/80.      Patient's hyperlipidemia is controlled with diet. Last lipids were at goal: Lab Results  Component Value Date   CHOL 108 08/09/2017   HDL 36 (L) 08/09/2017   LDLCALC 70 03/09/2016   TRIG 85 08/09/2017   CHOLHDL 3.0 08/09/2017      Patient has  Mprbid Obesity (BMI 30+) and is proactively screened for prediabetes and patient denies reactive hypoglycemic symptoms, visual blurring, diabetic polys, or paresthesias. Last A1c was Normal & at goal: Lab Results  Component Value Date   HGBA1C 5.1 08/09/2017      Finally, patient has history of Vitamin D Deficiency and last Vitamin D was at goal: Lab Results  Component Value Date   VD25OH 72 08/09/2017   Current Outpatient Medications on File Prior to Visit  Medication Sig  . bisoprolol-hydrochlorothiazide (ZIAC) 5-6.25 MG tablet Take 1 tablet daily for BP  . buPROPion (WELLBUTRIN XL) 150 MG 24 hr tablet TAKE 1 TABLET BY MOUTH  DAILY FOR MOOD OR  CONCENTRATION  . Cetirizine HCl (ZYRTEC ALLERGY PO) Take by mouth daily.  . Cholecalciferol (VITAMIN D PO) Take 2,000 Units by mouth 2 (two) times daily.  . COMBIPATCH 0.05-0.14 MG/DAY APPLY 1 PATCH TWICE WEEKLY  .  dicyclomine (BENTYL) 20 MG tablet Take 1 tablet 3 x / day before meals for IBS  . montelukast (SINGULAIR) 10 MG tablet Take 1 tablet daily for allergies  . Multiple Vitamins-Minerals (WOMENS MULTI PO) Take by mouth daily.  Marland Kitchen omeprazole (PRILOSEC) 40 MG capsule TK 1 C PO D  . OVER THE COUNTER MEDICATION Osteobioflex 1 daily  . OVER THE COUNTER MEDICATION Aleve 2 tabs daily  . sucralfate (CARAFATE) 1 G tablet TK 1 T PO QID  . Triamcinolone Acetonide (NASACORT AQ NA) Place into the nose. 1 spray into nostrils daily-OTC  . vitamin C (ASCORBIC ACID) 500 MG tablet Take 500 mg by mouth daily.   No current facility-administered medications on file prior to visit.    No Known Allergies  Past Medical History:  Diagnosis Date  . Allergy   . Hypertension    Health Maintenance  Topic Date Due  . MAMMOGRAM  07/07/2019  . PAP SMEAR  07/06/2020  . TETANUS/TDAP  04/22/2022  . COLONOSCOPY  12/24/2022  . INFLUENZA VACCINE  Completed  . Hepatitis C Screening  Completed  . HIV Screening  Completed   Immunization History  Administered Date(s) Administered  . Hepatitis B 06/12/1988  . Influenza-Unspecified 03/14/2016, 03/14/2017  . PPD Test 10/09/2013, 12/24/2014, 03/09/2016, 08/09/2017  . Td 04/22/2012   Last Colon - 12/23/2012 Colon - Dr Arty Baumgartner - recc 10 yr f/u  Last MGM - 06/2017 Past Surgical History:  Procedure Laterality Date  . BREAST SURGERY Bilateral 2009   Reduction  .  OVARIAN CYST REMOVAL  2001  . REDUCTION MAMMAPLASTY Bilateral 2009   Family History  Problem Relation Age of Onset  . Hypertension Mother   . Hypertension Father    Social History   Tobacco Use  . Smoking status: Never Smoker  Substance Use Topics  . Alcohol use: No  . Drug use: No    ROS Constitutional: Denies fever, chills, weight loss/gain, headaches, insomnia,  night sweats, and change in appetite. Does c/o fatigue. Eyes: Denies redness, blurred vision, diplopia, discharge, itchy, watery eyes.   ENT: Denies discharge, congestion, post nasal drip, epistaxis, sore throat, earache, hearing loss, dental pain, Tinnitus, Vertigo, Sinus pain, snoring.  Cardio: Denies chest pain, palpitations, irregular heartbeat, syncope, dyspnea, diaphoresis, orthopnea, PND, claudication, edema Respiratory: denies cough, dyspnea, DOE, pleurisy, hoarseness, laryngitis, wheezing.  Gastrointestinal: Denies dysphagia, heartburn, reflux, water brash, pain, cramps, nausea, vomiting, bloating, diarrhea, constipation, hematemesis, melena, hematochezia, jaundice, hemorrhoids Genitourinary: Denies dysuria, frequency, urgency, nocturia, hesitancy, discharge, hematuria, flank pain Breast: Breast lumps, nipple discharge, bleeding.  Musculoskeletal: Denies arthralgia, myalgia, stiffness, Jt. Swelling, pain, limp, and strain/sprain. Denies falls. Skin: Denies puritis, rash, hives, warts, acne, eczema, changing in skin lesion Neuro: No weakness, tremor, incoordination, spasms, paresthesia, pain Psychiatric: Denies confusion, memory loss, sensory loss. Denies Depression. Endocrine: Denies change in weight, skin, hair change, nocturia, and paresthesia, diabetic polys, visual blurring, hyper / hypo glycemic episodes.  Heme/Lymph: No excessive bleeding, bruising, enlarged lymph nodes.  Physical Exam  BP 110/80   Pulse 64   Temp 97.6 F (36.4 C)   Resp 16   Ht 5' 3.5" (1.613 m)   Wt 176 lb 3.2 oz (79.9 kg)   BMI 30.72 kg/m   General Appearance: Well nourished, well groomed and in no apparent distress.  Eyes: PERRLA, EOMs, conjunctiva no swelling or erythema, normal fundi and vessels. Sinuses: No frontal/maxillary tenderness ENT/Mouth: EACs patent / TMs  nl. Nares clear without erythema, swelling, mucoid exudates. Oral hygiene is good. No erythema, swelling, or exudate. Tongue normal, non-obstructing. Tonsils not swollen or erythematous. Hearing normal.  Neck: Supple, thyroid not palpable. No bruits, nodes or  JVD. Respiratory: Respiratory effort normal.  BS equal and clear bilateral without rales, rhonci, wheezing or stridor. Cardio: Heart sounds are normal with regular rate and rhythm and no murmurs, rubs or gallops. Peripheral pulses are normal and equal bilaterally without edema. No aortic or femoral bruits. Chest: symmetric with normal excursions and percussion. Breasts: Deferred to GYN.  Abdomen: Flat, soft with bowel sounds active. Nontender, no guarding, rebound, hernias, masses, or organomegaly.  Lymphatics: Non tender without lymphadenopathy.  Genitourinary: Deferred to GYN Musculoskeletal: Full ROM all peripheral extremities, joint stability, 5/5 strength, and normal gait. Skin: Warm and dry without rashes, lesions, cyanosis, clubbing or  ecchymosis.  Neuro: Cranial nerves intact, reflexes equal bilaterally. Normal muscle tone, no cerebellar symptoms. Sensation intact.  Pysch: Alert and oriented X 3, normal affect, Insight and Judgment appropriate.   Assessment and Plan  1. Annual Preventative Screening Examination  2. Essential hypertension  - Urinalysis, Routine w reflex microscopic - Microalbumin / creatinine urine ratio - EKG 12-Lead - [place], RETROPERITNL ABD,  LTD - CBC with Differential/Platelet - BASIC METABOLIC PANEL WITH GFR - Magnesium - TSH  3. Hyperlipidemia, mixed  - EKG 12-Lead - [place], RETROPERITNL ABD,  LTD - Hepatic function panel - Lipid panel - TSH  4. Abnormal glucose  - EKG 12-Lead - [place], RETROPERITNL ABD,  LTD - Hemoglobin A1c - Insulin, random  5. Vitamin D deficiency  -  VITAMIN D 25 Hydroxy   6. Gastroesophageal reflux disease  - CBC with Differential/Platelet  7. Screening for colorectal cancer  - POC Hemoccult Bld/Stl  8. Screening for ischemic heart disease  - EKG 12-Lead  9. FH: hypertension  - EKG 12-Lead - [place], RETROPERITNL ABD,  LTD  10. Screening examination for pulmonary tuberculosis  - PPD  11. Other fatigue  -  Iron,Total/Total Iron Binding Cap - Vitamin B12 - CBC with Differential/Platelet  12. Medication management  - Urinalysis, Routine w reflex microscopic - Microalbumin / creatinine urine ratio - CBC with Differential/Platelet - BASIC METABOLIC PANEL WITH GFR - Hepatic function panel - Magnesium - Lipid panel - TSH - Hemoglobin A1c - Insulin, random - VITAMIN D 25 Hydroxyl  13. Screening for AAA (aortic abdominal aneurysm)  - [place], RETROPERITNL ABD,  LTD         Patient was counseled in prudent diet to achieve/maintain BMI less than 25 for weight control, BP monitoring, regular exercise and medications. Discussed med's effects and SE's. Screening labs and tests as requested with regular follow-up as recommended. Over 40 minutes of exam, counseling, chart review and high complex critical decision making was performed.

## 2017-08-09 NOTE — Patient Instructions (Signed)
Preventive Care for Adults  A healthy lifestyle and preventive care can promote health and wellness. Preventive health guidelines for women include the following key practices.  A routine yearly physical is a good way to check with your health care provider about your health and preventive screening. It is a chance to share any concerns and updates on your health and to receive a thorough exam.  Visit your dentist for a routine exam and preventive care every 6 months. Brush your teeth twice a day and floss once a day. Good oral hygiene prevents tooth decay and gum disease.  The frequency of eye exams is based on your age, health, family medical history, use of contact lenses, and other factors. Follow your health care provider's recommendations for frequency of eye exams.  Eat a healthy diet. Foods like vegetables, fruits, whole grains, low-fat dairy products, and lean protein foods contain the nutrients you need without too many calories. Decrease your intake of foods high in solid fats, added sugars, and salt. Eat the right amount of calories for you.Get information about a proper diet from your health care provider, if necessary.  Regular physical exercise is one of the most important things you can do for your health. Most adults should get at least 150 minutes of moderate-intensity exercise (any activity that increases your heart rate and causes you to sweat) each week. In addition, most adults need muscle-strengthening exercises on 2 or more days a week.  Maintain a healthy weight. The body mass index (BMI) is a screening tool to identify possible weight problems. It provides an estimate of body fat based on height and weight. Your health care provider can find your BMI and can help you achieve or maintain a healthy weight.For adults 20 years and older:  A BMI below 18.5 is considered underweight.  A BMI of 18.5 to 24.9 is normal.  A BMI of 25 to 29.9 is considered overweight.  A BMI of  30 and above is considered obese.  Maintain normal blood lipids and cholesterol levels by exercising and minimizing your intake of saturated fat. Eat a balanced diet with plenty of fruit and vegetables. Blood tests for lipids and cholesterol should begin at age 33 and be repeated every 5 years. If your lipid or cholesterol levels are high, you are over 50, or you are at high risk for heart disease, you may need your cholesterol levels checked more frequently.Ongoing high lipid and cholesterol levels should be treated with medicines if diet and exercise are not working.  If you smoke, find out from your health care provider how to quit. If you do not use tobacco, do not start.  Lung cancer screening is recommended for adults aged 44-80 years who are at high risk for developing lung cancer because of a history of smoking. A yearly low-dose CT scan of the lungs is recommended for people who have at least a 30-pack-year history of smoking and are a current smoker or have quit within the past 15 years. A pack year of smoking is smoking an average of 1 pack of cigarettes a day for 1 year (for example: 1 pack a day for 30 years or 2 packs a day for 15 years). Yearly screening should continue until the smoker has stopped smoking for at least 15 years. Yearly screening should be stopped for people who develop a health problem that would prevent them from having lung cancer treatment.  High blood pressure causes heart disease and increases the risk of  stroke. Your blood pressure should be checked at least every 1 to 2 years. Ongoing high blood pressure should be treated with medicines if weight loss and exercise do not work.  If you are 32-36 years old, ask your health care provider if you should take aspirin to prevent strokes.  Diabetes screening involves taking a blood sample to check your fasting blood sugar level. This should be done once every 3 years, after age 2, if you are within normal weight and  without risk factors for diabetes. Testing should be considered at a younger age or be carried out more frequently if you are overweight and have at least 1 risk factor for diabetes.  Breast cancer screening is essential preventive care for women. You should practice "breast self-awareness." This means understanding the normal appearance and feel of your breasts and may include breast self-examination. Any changes detected, no matter how small, should be reported to a health care provider. Women in their 44s and 30s should have a clinical breast exam (CBE) by a health care provider as part of a regular health exam every 1 to 3 years. After age 66, women should have a CBE every year. Starting at age 2, women should consider having a mammogram (breast X-ray test) every year. Women who have a family history of breast cancer should talk to their health care provider about genetic screening. Women at a high risk of breast cancer should talk to their health care providers about having an MRI and a mammogram every year.  Breast cancer gene (BRCA)-related cancer risk assessment is recommended for women who have family members with BRCA-related cancers. BRCA-related cancers include breast, ovarian, tubal, and peritoneal cancers. Having family members with these cancers may be associated with an increased risk for harmful changes (mutations) in the breast cancer genes BRCA1 and BRCA2. Results of the assessment will determine the need for genetic counseling and BRCA1 and BRCA2 testing.  Routine pelvic exams to screen for cancer are no longer recommended for nonpregnant women who are considered low risk for cancer of the pelvic organs (ovaries, uterus, and vagina) and who do not have symptoms. Ask your health care provider if a screening pelvic exam is right for you.  If you have had past treatment for cervical cancer or a condition that could lead to cancer, you need Pap tests and screening for cancer for at least 20  years after your treatment. If Pap tests have been discontinued, your risk factors (such as having a new sexual partner) need to be reassessed to determine if screening should be resumed. Some women have medical problems that increase the chance of getting cervical cancer. In these cases, your health care provider may recommend more frequent screening and Pap tests.  Colorectal cancer can be detected and often prevented. Most routine colorectal cancer screening begins at the age of 54 years and continues through age 5 years. However, your health care provider may recommend screening at an earlier age if you have risk factors for colon cancer. On a yearly basis, your health care provider may provide home test kits to check for hidden blood in the stool. Use of a small camera at the end of a tube, to directly examine the colon (sigmoidoscopy or colonoscopy), can detect the earliest forms of colorectal cancer. Talk to your health care provider about this at age 38, when routine screening begins. Direct exam of the colon should be repeated every 5-10 years through age 78 years, unless early forms of pre-cancerous  polyps or small growths are found.  Hepatitis C blood testing is recommended for all people born from 1945 through 1965 and any individual with known risks for hepatitis C.  Pra  Osteoporosis is a disease in which the bones lose minerals and strength with aging. This can result in serious bone fractures or breaks. The risk of osteoporosis can be identified using a bone density scan. Women ages 65 years and over and women at risk for fractures or osteoporosis should discuss screening with their health care providers. Ask your health care provider whether you should take a calcium supplement or vitamin D to reduce the rate of osteoporosis.  Menopause can be associated with physical symptoms and risks. Hormone replacement therapy is available to decrease symptoms and risks. You should talk to your  health care provider about whether hormone replacement therapy is right for you.  Use sunscreen. Apply sunscreen liberally and repeatedly throughout the day. You should seek shade when your shadow is shorter than you. Protect yourself by wearing long sleeves, pants, a wide-brimmed hat, and sunglasses year round, whenever you are outdoors.  Once a month, do a whole body skin exam, using a mirror to look at the skin on your back. Tell your health care provider of new moles, moles that have irregular borders, moles that are larger than a pencil eraser, or moles that have changed in shape or color.  Stay current with required vaccines (immunizations).  Influenza vaccine. All adults should be immunized every year.  Tetanus, diphtheria, and acellular pertussis (Td, Tdap) vaccine. Pregnant women should receive 1 dose of Tdap vaccine during each pregnancy. The dose should be obtained regardless of the length of time since the last dose. Immunization is preferred during the 27th-36th week of gestation. An adult who has not previously received Tdap or who does not know her vaccine status should receive 1 dose of Tdap. This initial dose should be followed by tetanus and diphtheria toxoids (Td) booster doses every 10 years. Adults with an unknown or incomplete history of completing a 3-dose immunization series with Td-containing vaccines should begin or complete a primary immunization series including a Tdap dose. Adults should receive a Td booster every 10 years.  Varicella vaccine. An adult without evidence of immunity to varicella should receive 2 doses or a second dose if she has previously received 1 dose. Pregnant females who do not have evidence of immunity should receive the first dose after pregnancy. This first dose should be obtained before leaving the health care facility. The second dose should be obtained 4-8 weeks after the first dose.  Human papillomavirus (HPV) vaccine. Females aged 13-26 years  who have not received the vaccine previously should obtain the 3-dose series. The vaccine is not recommended for use in pregnant females. However, pregnancy testing is not needed before receiving a dose. If a female is found to be pregnant after receiving a dose, no treatment is needed. In that case, the remaining doses should be delayed until after the pregnancy. Immunization is recommended for any person with an immunocompromised condition through the age of 26 years if she did not get any or all doses earlier. During the 3-dose series, the second dose should be obtained 4-8 weeks after the first dose. The third dose should be obtained 24 weeks after the first dose and 16 weeks after the second dose.  Zoster vaccine. One dose is recommended for adults aged 60 years or older unless certain conditions are present.  Measles, mumps, and rubella (  MMR) vaccine. Adults born before [birth year] generally are considered immune to measles and mumps. Adults born in [birth year] or later should have 1 or more doses of MMR vaccine unless there is a contraindication to the vaccine or there is laboratory evidence of immunity to each of the three diseases. A routine second dose of MMR vaccine should be obtained at least 28 days after the first dose for students attending postsecondary schools, health care workers, or international travelers. People who received inactivated measles vaccine or an unknown type of measles vaccine during 1963-1967 should receive 2 doses of MMR vaccine. People who received inactivated mumps vaccine or an unknown type of mumps vaccine before 1979 and are at high risk for mumps infection should consider immunization with 2 doses of MMR vaccine. For females of childbearing age, rubella immunity should be determined. If there is no evidence of immunity, females who are not pregnant should be vaccinated. If there is no evidence of immunity, females who are pregnant should delay immunization until after pregnancy.  Unvaccinated health care workers born before [birth year] who lack laboratory evidence of measles, mumps, or rubella immunity or laboratory confirmation of disease should consider measles and mumps immunization with 2 doses of MMR vaccine or rubella immunization with 1 dose of MMR vaccine.  Pneumococcal 13-valent conjugate (PCV13) vaccine. When indicated, a person who is uncertain of her immunization history and has no record of immunization should receive the PCV13 vaccine. An adult aged 39 years or older who has certain medical conditions and has not been previously immunized should receive 1 dose of PCV13 vaccine. This PCV13 should be followed with a dose of pneumococcal polysaccharide (PPSV23) vaccine. The PPSV23 vaccine dose should be obtained at least 8 weeks after the dose of PCV13 vaccine. An adult aged 62 years or older who has certain medical conditions and previously received 1 or more doses of PPSV23 vaccine should receive 1 dose of PCV13. The PCV13 vaccine dose should be obtained 1 or more years after the last PPSV23 vaccine dose.    Pneumococcal polysaccharide (PPSV23) vaccine. When PCV13 is also indicated, PCV13 should be obtained first. All adults aged 67 years and older should be immunized. An adult younger than age 45 years who has certain medical conditions should be immunized. Any person who resides in a nursing home or long-term care facility should be immunized. An adult smoker should be immunized. People with an immunocompromised condition and certain other conditions should receive both PCV13 and PPSV23 vaccines. People with human immunodeficiency virus (HIV) infection should be immunized as soon as possible after diagnosis. Immunization during chemotherapy or radiation therapy should be avoided. Routine use of PPSV23 vaccine is not recommended for American Indians, [place] Natives, or people younger than 65 years unless there are medical conditions that require PPSV23 vaccine. When indicated,  people who have unknown immunization and have no record of immunization should receive PPSV23 vaccine. One-time revaccination 5 years after the first dose of PPSV23 is recommended for people aged 19-64 years who have chronic kidney failure, nephrotic syndrome, asplenia, or immunocompromised conditions. People who received 1-2 doses of PPSV23 before age 23 years should receive another dose of PPSV23 vaccine at age 35 years or later if at least 5 years have passed since the previous dose. Doses of PPSV23 are not needed for people immunized with PPSV23 at or after age 38 years.  Preventive Services / Frequency   Ages 43 to 86 years  Blood pressure check.  Lipid and cholesterol check.  Lung  cancer screening. / Every year if you are aged 17-80 years and have a 30-pack-year history of smoking and currently smoke or have quit within the past 15 years. Yearly screening is stopped once you have quit smoking for at least 15 years or develop a health problem that would prevent you from having lung cancer treatment.  Clinical breast exam.** / Every year after age 72 years.  BRCA-related cancer risk assessment.** / For women who have family members with a BRCA-related cancer (breast, ovarian, tubal, or peritoneal cancers).  Mammogram.** / Every year beginning at age 64 years and continuing for as long as you are in good health. Consult with your health care provider.  Pap test.** / Every 3 years starting at age 34 years through age 48 or 45 years with a history of 3 consecutive normal Pap tests.  HPV screening.** / Every 3 years from ages 8 years through ages 39 to 39 years with a history of 3 consecutive normal Pap tests.  Fecal occult blood test (FOBT) of stool. / Every year beginning at age 57 years and continuing until age 16 years. You may not need to do this test if you get a colonoscopy every 10 years.  Flexible sigmoidoscopy or colonoscopy.** / Every 5 years for a flexible sigmoidoscopy or  every 10 years for a colonoscopy beginning at age 54 years and continuing until age 22 years.  Hepatitis C blood test.** / For all people born from [birth year] through 1965 and any individual with known risks for hepatitis C.  Skin self-exam. / Monthly.  Influenza vaccine. / Every year.  Tetanus, diphtheria, and acellular pertussis (Tdap/Td) vaccine.** / Consult your health care provider. Pregnant women should receive 1 dose of Tdap vaccine during each pregnancy. 1 dose of Td every 10 years.  Varicella vaccine.** / Consult your health care provider. Pregnant females who do not have evidence of immunity should receive the first dose after pregnancy.  Zoster vaccine.** / 1 dose for adults aged 18 years or older.  Pneumococcal 13-valent conjugate (PCV13) vaccine.** / Consult your health care provider.  Pneumococcal polysaccharide (PPSV23) vaccine.** / 1 to 2 doses if you smoke cigarettes or if you have certain conditions.  Meningococcal vaccine.** / Consult your health care provider.  Hepatitis A vaccine.** / Consult your health care provider.  Hepatitis B vaccine.** / Consult your health care provider. Screening for abdominal aortic aneurysm (AAA)  by ultrasound is recommended for people over 50 who have history of high blood pressure or who are current or former smokers. ++++++++++++++++++ Recommend Adult Low Dose Aspirin or  coated  Aspirin 81 mg daily  To reduce risk of Colon Cancer 20 %,  Skin Cancer 26 % ,  Melanoma 46%  and  Pancreatic cancer 60% +++++++++++++++++++ Vitamin D goal  is between 70-100.  Please make sure that you are taking your Vitamin D as directed.  It is very important as a natural anti-inflammatory  helping hair, skin, and nails, as well as reducing stroke and heart attack risk.  It helps your bones and helps with mood. It also decreases numerous cancer risks so please take it as directed.  Low Vit D is associated with a 200-300% higher risk for CANCER  and  200-300% higher risk for HEART   ATTACK  &  STROKE.   .....................................Marland Kitchen It is also associated with higher death rate at younger ages,  autoimmune diseases like Rheumatoid arthritis, Lupus, Multiple Sclerosis.    Also many other serious conditions, like depression,  Alzheimer's Dementia, infertility, muscle aches, fatigue, fibromyalgia - just to name a few. ++++++++++++++++++ Recommend the book "The END of DIETING" by Dr Excell Seltzer  & the book "The END of DIABETES " by Dr Excell Seltzer At [email or web address] - get book & Audio CD's    Being diabetic has a  300% increased risk for heart attack, stroke, cancer, and alzheimer- type vascular dementia. It is very important that you work harder with diet by avoiding all foods that are white. Avoid white rice (brown & wild rice is OK), white potatoes (sweetpotatoes in moderation is OK), White bread or wheat bread or anything made out of white flour like bagels, donuts, rolls, buns, biscuits, cakes, pastries, cookies, pizza crust, and pasta (made from white flour & egg whites) - vegetarian pasta or spinach or wheat pasta is OK. Multigrain breads like Arnold's or [employer], or multigrain sandwich thins or flatbreads.  Diet, exercise and weight loss can reverse and cure diabetes in the early stages.  Diet, exercise and weight loss is very important in the control and prevention of complications of diabetes which affects every system in your body, ie. Brain - dementia/stroke, eyes - glaucoma/blindness, heart - heart attack/heart failure, kidneys - dialysis, stomach - gastric paralysis, intestines - malabsorption, nerves - severe painful neuritis, circulation - gangrene & loss of a leg(s), and finally cancer and Alzheimers.    I recommend avoid fried & greasy foods,  sweets/candy, white rice (brown or wild rice or Quinoa is OK), white potatoes (sweet potatoes are OK) - anything made from white flour - bagels, doughnuts, rolls, buns, biscuits,white  and wheat breads, pizza crust and traditional pasta made of white flour & egg white(vegetarian pasta or spinach or wheat pasta is OK).  Multi-grain bread is OK - like multi-grain flat bread or sandwich thins. Avoid alcohol in excess. Exercise is also important.    Eat all the vegetables you want - avoid meat, especially red meat and dairy - especially cheese.  Cheese is the most concentrated form of trans-fats which is the worst thing to clog up our arteries. Veggie cheese is OK which can be found in the fresh produce section at [employer] or [employer] or [employer]  ++++++++++++++++++++++ DASH Eating Plan  DASH stands for "Dietary Approaches to Stop Hypertension."   The DASH eating plan is a healthy eating plan that has been shown to reduce high blood pressure (hypertension). Additional health benefits may include reducing the risk of type 2 diabetes mellitus, heart disease, and stroke. The DASH eating plan may also help with weight loss. WHAT DO I NEED TO KNOW ABOUT THE DASH EATING PLAN? For the DASH eating plan, you will follow these general guidelines:  Choose foods with a percent daily value for sodium of less than 5% (as listed on the food label).  Use salt-free seasonings or herbs instead of table salt or sea salt.  Check with your health care provider or pharmacist before using salt substitutes.  Eat lower-sodium products, often labeled as "lower sodium" or "no salt added."  Eat fresh foods.  Eat more vegetables, fruits, and low-fat dairy products.  Choose whole grains. Look for the word "whole" as the first word in the ingredient list.  Choose fish   Limit sweets, desserts, sugars, and sugary drinks.  Choose heart-healthy fats.  Eat veggie cheese   Eat more home-cooked food and less restaurant, buffet, and fast food.  Limit fried foods.  Cook foods using methods other than frying.  Limit canned  vegetables. If you do use them, rinse them well to decrease the  sodium.  When eating at a restaurant, ask that your food be prepared with less salt, or no salt if possible.                      WHAT FOODS CAN I EAT? Read Dr Joel Fuhrman's books on The End of Dieting & The End of Diabetes  Grains Whole grain or whole wheat bread. Brown rice. Whole grain or whole wheat pasta. Quinoa, bulgur, and whole grain cereals. Low-sodium cereals. Corn or whole wheat flour tortillas. Whole grain cornbread. Whole grain crackers. Low-sodium crackers.  Vegetables Fresh or frozen vegetables (raw, steamed, roasted, or grilled). Low-sodium or reduced-sodium tomato and vegetable juices. Low-sodium or reduced-sodium tomato sauce and paste. Low-sodium or reduced-sodium canned vegetables.   Fruits All fresh, canned (in natural juice), or frozen fruits.  Protein Products  All fish and seafood.  Dried beans, peas, or lentils. Unsalted nuts and seeds. Unsalted canned beans.  Dairy Low-fat dairy products, such as skim or 1% milk, 2% or reduced-fat cheeses, low-fat ricotta or cottage cheese, or plain low-fat yogurt. Low-sodium or reduced-sodium cheeses.  Fats and Oils Tub margarines without trans fats. Light or reduced-fat mayonnaise and salad dressings (reduced sodium). Avocado. Safflower, olive, or canola oils. Natural peanut or almond butter.  Other Unsalted popcorn and pretzels. The items listed above may not be a complete list of recommended foods or beverages. Contact your dietitian for more options.  ++++++++++++++++++  WHAT FOODS ARE NOT RECOMMENDED? Grains/ White flour or wheat flour White bread. White pasta. White rice. Refined cornbread. Bagels and croissants. Crackers that contain trans fat.  Vegetables  Creamed or fried vegetables. Vegetables in a . Regular canned vegetables. Regular canned tomato sauce and paste. Regular tomato and vegetable juices.  Fruits Dried fruits. Canned fruit in light or heavy syrup. Fruit juice.  Meat and Other Protein  Products Meat in general - RED meat & White meat.  Fatty cuts of meat. Ribs, chicken wings, all processed meats as bacon, sausage, bologna, salami, fatback, hot dogs, bratwurst and packaged luncheon meats.  Dairy Whole or 2% milk, cream, half-and-half, and cream cheese. Whole-fat or sweetened yogurt. Full-fat cheeses or blue cheese. Non-dairy creamers and whipped toppings. Processed cheese, cheese spreads, or cheese curds.  Condiments Onion and garlic salt, seasoned salt, table salt, and sea salt. Canned and packaged gravies. Worcestershire sauce. Tartar sauce. Barbecue sauce. Teriyaki sauce. Soy sauce, including reduced sodium. Steak sauce. Fish sauce. Oyster sauce. Cocktail sauce. Horseradish. Ketchup and mustard. Meat flavorings and tenderizers. Bouillon cubes. Hot sauce. Tabasco sauce. Marinades. Taco seasonings. Relishes.  Fats and Oils Butter, stick margarine, lard, shortening and bacon fat. Coconut, palm kernel, or palm oils. Regular salad dressings.  Pickles and olives. Salted popcorn and pretzels.  The items listed above may not be a complete list of foods and beverages to avoid.  \  

## 2017-08-10 LAB — LIPID PANEL
Cholesterol: 108 mg/dL (ref <200)
HDL: 36 mg/dL — AB (ref >50)
LDL Cholesterol (Calc): 55 mg/dL (calc)
NON-HDL CHOLESTEROL (CALC): 72 mg/dL (ref <130)
TRIGLYCERIDES: 85 mg/dL (ref <150)
Total CHOL/HDL Ratio: 3 (calc) (ref <5.0)

## 2017-08-10 LAB — URINALYSIS, ROUTINE W REFLEX MICROSCOPIC
BILIRUBIN URINE: NEGATIVE
GLUCOSE, UA: NEGATIVE
HGB URINE DIPSTICK: NEGATIVE
Ketones, ur: NEGATIVE
LEUKOCYTES UA: NEGATIVE
Nitrite: NEGATIVE
Protein, ur: NEGATIVE
Specific Gravity, Urine: 1.011 (ref 1.001–1.03)
pH: 6 (ref 5.0–8.0)

## 2017-08-10 LAB — CBC WITH DIFFERENTIAL/PLATELET
Basophils Absolute: 64 cells/uL (ref 0–200)
Basophils Relative: 0.7 %
Eosinophils Absolute: 437 cells/uL (ref 15–500)
Eosinophils Relative: 4.8 %
HCT: 42.7 % (ref 35.0–45.0)
Hemoglobin: 15 g/dL (ref 11.7–15.5)
Lymphs Abs: 3504 cells/uL (ref 850–3900)
MCH: 31.4 pg (ref 27.0–33.0)
MCHC: 35.1 g/dL (ref 32.0–36.0)
MCV: 89.3 fL (ref 80.0–100.0)
MPV: 13.4 fL — ABNORMAL HIGH (ref 7.5–12.5)
Monocytes Relative: 9.5 %
NEUTROS PCT: 46.5 %
Neutro Abs: 4232 cells/uL (ref 1500–7800)
PLATELETS: 167 10*3/uL (ref 140–400)
RBC: 4.78 10*6/uL (ref 3.80–5.10)
RDW: 12.9 % (ref 11.0–15.0)
TOTAL LYMPHOCYTE: 38.5 %
WBC: 9.1 10*3/uL (ref 3.8–10.8)
WBCMIX: 865 {cells}/uL (ref 200–950)

## 2017-08-10 LAB — HEPATIC FUNCTION PANEL
AG Ratio: 2.3 (calc) (ref 1.0–2.5)
ALKALINE PHOSPHATASE (APISO): 78 U/L (ref 33–130)
ALT: 17 U/L (ref 6–29)
AST: 21 U/L (ref 10–35)
Albumin: 4.3 g/dL (ref 3.6–5.1)
BILIRUBIN DIRECT: 0.1 mg/dL (ref 0.0–0.2)
BILIRUBIN TOTAL: 0.4 mg/dL (ref 0.2–1.2)
Globulin: 1.9 g/dL (calc) (ref 1.9–3.7)
Indirect Bilirubin: 0.3 mg/dL (calc) (ref 0.2–1.2)
Total Protein: 6.2 g/dL (ref 6.1–8.1)

## 2017-08-10 LAB — MICROALBUMIN / CREATININE URINE RATIO
CREATININE, URINE: 63 mg/dL (ref 20–275)
Microalb Creat Ratio: 8 mcg/mg creat (ref <30)
Microalb, Ur: 0.5 mg/dL

## 2017-08-10 LAB — IRON, TOTAL/TOTAL IRON BINDING CAP
%SAT: 23 % (calc) (ref 11–50)
Iron: 64 ug/dL (ref 45–160)
TIBC: 281 mcg/dL (calc) (ref 250–450)

## 2017-08-10 LAB — BASIC METABOLIC PANEL WITH GFR
BUN: 20 mg/dL (ref 7–25)
CHLORIDE: 106 mmol/L (ref 98–110)
CO2: 28 mmol/L (ref 20–32)
Calcium: 9.1 mg/dL (ref 8.6–10.4)
Creat: 0.74 mg/dL (ref 0.50–1.05)
GFR, Est African American: 106 mL/min/{1.73_m2} (ref >=60)
GFR, Est Non African American: 92 mL/min/{1.73_m2} (ref >=60)
Glucose, Bld: 76 mg/dL (ref 65–99)
POTASSIUM: 3.7 mmol/L (ref 3.5–5.3)
SODIUM: 141 mmol/L (ref 135–146)

## 2017-08-10 LAB — MAGNESIUM: MAGNESIUM: 2 mg/dL (ref 1.5–2.5)

## 2017-08-10 LAB — INSULIN, RANDOM: INSULIN: 4.3 u[IU]/mL (ref 2.0–19.6)

## 2017-08-10 LAB — HEMOGLOBIN A1C
EAG (MMOL/L): 5.5 (calc)
Hgb A1c MFr Bld: 5.1 % of total Hgb (ref <5.7)
MEAN PLASMA GLUCOSE: 100 (calc)

## 2017-08-10 LAB — VITAMIN B12: VITAMIN B 12: 417 pg/mL (ref 200–1100)

## 2017-08-10 LAB — TSH: TSH: 1.59 mIU/L

## 2017-08-10 LAB — VITAMIN D 25 HYDROXY (VIT D DEFICIENCY, FRACTURES): Vit D, 25-Hydroxy: 72 ng/mL (ref 30–100)

## 2017-08-11 ENCOUNTER — Encounter: Payer: Self-pay | Admitting: Internal Medicine

## 2017-08-13 ENCOUNTER — Encounter (INDEPENDENT_AMBULATORY_CARE_PROVIDER_SITE_OTHER): Payer: Self-pay

## 2017-08-13 LAB — TB SKIN TEST
INDURATION: 0 mm
TB Skin Test: NEGATIVE

## 2017-08-14 ENCOUNTER — Encounter (INDEPENDENT_AMBULATORY_CARE_PROVIDER_SITE_OTHER): Payer: Self-pay

## 2017-08-14 ENCOUNTER — Other Ambulatory Visit: Payer: Self-pay | Admitting: Internal Medicine

## 2017-08-14 MED ORDER — TOPIRAMATE 50 MG PO TABS: ORAL_TABLET | ORAL | 1 refills | Status: DC

## 2017-08-28 ENCOUNTER — Other Ambulatory Visit: Payer: Self-pay

## 2017-08-28 DIAGNOSIS — Z1212 Encounter for screening for malignant neoplasm of rectum: Principal | ICD-10-CM

## 2017-08-28 DIAGNOSIS — Z1211 Encounter for screening for malignant neoplasm of colon: Secondary | ICD-10-CM

## 2017-08-28 LAB — POC HEMOCCULT BLD/STL (HOME/3-CARD/SCREEN)
Card #3 Fecal Occult Blood, POC: NEGATIVE
FECAL OCCULT BLD: NEGATIVE
FECAL OCCULT BLD: NEGATIVE

## 2017-08-30 ENCOUNTER — Encounter: Payer: Self-pay | Admitting: Internal Medicine

## 2017-08-30 MED ORDER — MONTELUKAST SODIUM 10 MG PO TABS
ORAL_TABLET | ORAL | 3 refills | Status: DC
Start: 2017-08-30 — End: 2019-03-04

## 2017-09-17 ENCOUNTER — Other Ambulatory Visit: Payer: Self-pay | Admitting: Internal Medicine

## 2017-10-16 DIAGNOSIS — N951 Menopausal and female climacteric states: Secondary | ICD-10-CM | POA: Diagnosis not present

## 2017-10-16 DIAGNOSIS — N950 Postmenopausal bleeding: Secondary | ICD-10-CM | POA: Diagnosis not present

## 2017-11-17 ENCOUNTER — Other Ambulatory Visit: Payer: Self-pay | Admitting: Internal Medicine

## 2017-11-17 DIAGNOSIS — I10 Essential (primary) hypertension: Secondary | ICD-10-CM

## 2018-01-23 ENCOUNTER — Other Ambulatory Visit: Payer: Self-pay | Admitting: Internal Medicine

## 2018-02-04 ENCOUNTER — Other Ambulatory Visit: Payer: Self-pay | Admitting: Internal Medicine

## 2018-03-28 DIAGNOSIS — D225 Melanocytic nevi of trunk: Secondary | ICD-10-CM | POA: Diagnosis not present

## 2018-03-28 DIAGNOSIS — D485 Neoplasm of uncertain behavior of skin: Secondary | ICD-10-CM | POA: Diagnosis not present

## 2018-03-28 DIAGNOSIS — L821 Other seborrheic keratosis: Secondary | ICD-10-CM | POA: Diagnosis not present

## 2018-04-07 ENCOUNTER — Other Ambulatory Visit: Payer: Self-pay | Admitting: Adult Health

## 2018-04-07 DIAGNOSIS — I10 Essential (primary) hypertension: Secondary | ICD-10-CM

## 2018-06-06 DIAGNOSIS — Z0100 Encounter for examination of eyes and vision without abnormal findings: Secondary | ICD-10-CM | POA: Diagnosis not present

## 2018-06-17 ENCOUNTER — Encounter: Payer: Self-pay | Admitting: Internal Medicine

## 2018-07-10 ENCOUNTER — Other Ambulatory Visit: Payer: Self-pay | Admitting: Internal Medicine

## 2018-07-10 MED ORDER — BUPROPION HCL ER (XL) 300 MG PO TB24: ORAL_TABLET | ORAL | 3 refills | Status: DC

## 2018-07-10 MED ORDER — TOPIRAMATE 50 MG PO TABS: ORAL_TABLET | ORAL | 3 refills | Status: DC

## 2018-07-12 ENCOUNTER — Other Ambulatory Visit: Payer: Self-pay | Admitting: Internal Medicine

## 2018-07-12 ENCOUNTER — Ambulatory Visit
Admission: RE | Admit: 2018-07-12 | Discharge: 2018-07-12 | Disposition: A | Discharge status: Home / Self Care | Payer: 59 | Source: Ambulatory Visit | Attending: Internal Medicine | Admitting: Internal Medicine

## 2018-07-12 DIAGNOSIS — Z1231 Encounter for screening mammogram for malignant neoplasm of breast: Secondary | ICD-10-CM | POA: Diagnosis not present

## 2018-08-26 ENCOUNTER — Other Ambulatory Visit: Payer: Self-pay | Admitting: Internal Medicine

## 2018-08-26 MED ORDER — HYDROXYCHLOROQUINE SULFATE 200 MG PO TABS: ORAL_TABLET | ORAL | 1 refills | Status: DC

## 2018-09-18 ENCOUNTER — Encounter: Payer: Self-pay | Admitting: Internal Medicine

## 2018-09-26 ENCOUNTER — Encounter: Payer: Self-pay | Admitting: Internal Medicine

## 2018-10-05 ENCOUNTER — Other Ambulatory Visit: Payer: Self-pay | Admitting: Internal Medicine

## 2018-10-05 DIAGNOSIS — I10 Essential (primary) hypertension: Secondary | ICD-10-CM

## 2018-11-05 ENCOUNTER — Other Ambulatory Visit: Payer: Self-pay | Admitting: Internal Medicine

## 2018-11-06 ENCOUNTER — Encounter: Payer: Self-pay | Admitting: Internal Medicine

## 2018-12-14 ENCOUNTER — Encounter: Payer: Self-pay | Admitting: Internal Medicine

## 2018-12-14 NOTE — Patient Instructions (Signed)
- Vit D  And Vit C 1,000 mg  are recommended to help protect  against the Covid_19 and other Corona viruses.   - Also it's recommended to take Zinc 50 mg to help  protect against the Covid_19  And best place to get  is also on [email or web address] and don't pay more than 6-8 cents /pill !   ================================ Coronavirus (COVID-19) Are you at risk?  Are you at risk for the Coronavirus (COVID-19)?  To be considered HIGH RISK for Coronavirus (COVID-19), you have to meet the following criteria:  . Traveled to [place], [place], [place], [place] or [place]; or in the [place] to [place], [place], [place]  . or [place]; and have fever, cough, and shortness of breath within the last 2 weeks of travel OR . Been in close contact with a person diagnosed with COVID-19 within the last 2 weeks and have  . fever, cough,and shortness of breath .  . IF YOU DO NOT MEET THESE CRITERIA, YOU ARE CONSIDERED LOW RISK FOR COVID-19.  What to do if you are HIGH RISK for COVID-19?  Marland Kitchen If you are having a medical emergency, call 911. . Seek medical care right away. Before you go to a doctor's office, urgent care or emergency department, .  call ahead and tell them about your recent travel, contact with someone diagnosed with COVID-19  .  and your symptoms.  . You should receive instructions from your physician's office regarding next steps of care.  . When you arrive at healthcare provider, tell the healthcare staff immediately you have returned from  . visiting [place], [place], [place], [place] or [place]; or traveled in the [place] to [place], [place],  . [place] or [place] in the last two weeks or you have been in close contact with a person diagnosed with  . COVID-19 in the last 2 weeks.   . Tell the health care staff about your symptoms: fever, cough and shortness of breath. . After you have been seen by a medical provider, you will be either: o Tested for (COVID-19) and  discharged home on quarantine except to seek medical care if  o symptoms worsen, and asked to  - Stay home and avoid contact with others until you get your results (4-5 days)  - Avoid travel on public transportation if possible (such as bus, train, or airplane) or o Sent to the Emergency Department by EMS for evaluation, COVID-19 testing  and  o possible admission depending on your condition and test results.  What to do if you are LOW RISK for COVID-19?  Reduce your risk of any infection by using the same precautions used for avoiding the common cold or flu:  Marland Kitchen Wash your hands often with soap and warm water for at least 20 seconds.  If soap and water are not readily available,  . use an alcohol-based hand sanitizer with at least 60% alcohol.  . If coughing or sneezing, cover your mouth and nose by coughing or sneezing into the elbow areas of your shirt or coat, .  into a tissue or into your sleeve (not your hands). . Avoid shaking hands with others and consider head nods or verbal greetings only. . Avoid touching your eyes, nose, or mouth with unwashed hands.  . Avoid close contact with people who are sick. . Avoid places or events with large numbers of people in one location, like concerts or sporting events. . Carefully consider travel plans  you have or are making. . If you are planning any travel outside or inside the [place], visit the CDC's Travelers' Health webpage for the latest health notices. . If you have some symptoms but not all symptoms, continue to monitor at home and seek medical attention  . if your symptoms worsen. . If you are having a medical emergency, call 911. >>>>>>>>>>>>>>>>>>>>>>> Preventive Care for Adults  A healthy lifestyle and preventive care can promote health and wellness. Preventive health guidelines for women include the following key practices.  A routine yearly physical is a good way to check with your health care provider about your health and preventive  screening. It is a chance to share any concerns and updates on your health and to receive a thorough exam.  Visit your dentist for a routine exam and preventive care every 6 months. Brush your teeth twice a day and floss once a day. Good oral hygiene prevents tooth decay and gum disease.  The frequency of eye exams is based on your age, health, family medical history, use of contact lenses, and other factors. Follow your health care provider's recommendations for frequency of eye exams.  Eat a healthy diet. Foods like vegetables, fruits, whole grains, low-fat dairy products, and lean protein foods contain the nutrients you need without too many calories. Decrease your intake of foods high in solid fats, added sugars, and salt. Eat the right amount of calories for you. Get information about a proper diet from your health care provider, if necessary.  Regular physical exercise is one of the most important things you can do for your health. Most adults should get at least 150 minutes of moderate-intensity exercise (any activity that increases your heart rate and causes you to sweat) each week. In addition, most adults need muscle-strengthening exercises on 2 or more days a week.  Maintain a healthy weight. The body mass index (BMI) is a screening tool to identify possible weight problems. It provides an estimate of body fat based on height and weight. Your health care provider can find your BMI and can help you achieve or maintain a healthy weight. For adults 20 years and older:  A BMI below 18.5 is considered underweight.  A BMI of 18.5 to 24.9 is normal.  A BMI of 25 to 29.9 is considered overweight.  A BMI of 30 and above is considered obese.  Maintain normal blood lipids and cholesterol levels by exercising and minimizing your intake of saturated fat. Eat a balanced diet with plenty of fruit and vegetables. Blood tests for lipids and cholesterol should begin at age 65 and be repeated every 5  years. If your lipid or cholesterol levels are high, you are over 50, or you are at high risk for heart disease, you may need your cholesterol levels checked more frequently. Ongoing high lipid and cholesterol levels should be treated with medicines if diet and exercise are not working.  If you smoke, find out from your health care provider how to quit. If you do not use tobacco, do not start.  Lung cancer screening is recommended for adults aged 74-80 years who are at high risk for developing lung cancer because of a history of smoking. A yearly low-dose CT scan of the lungs is recommended for people who have at least a 30-pack-year history of smoking and are a current smoker or have quit within the past 15 years. A pack year of smoking is smoking an average of 1 pack of cigarettes a day  for 1 year (for example: 1 pack a day for 30 years or 2 packs a day for 15 years). Yearly screening should continue until the smoker has stopped smoking for at least 15 years. Yearly screening should be stopped for people who develop a health problem that would prevent them from having lung cancer treatment.  High blood pressure causes heart disease and increases the risk of stroke. Your blood pressure should be checked at least every 1 to 2 years. Ongoing high blood pressure should be treated with medicines if weight loss and exercise do not work.  If you are 1-46 years old, ask your health care provider if you should take aspirin to prevent strokes.  Diabetes screening involves taking a blood sample to check your fasting blood sugar level. This should be done once every 3 years, after age 66, if you are within normal weight and without risk factors for diabetes. Testing should be considered at a younger age or be carried out more frequently if you are overweight and have at least 1 risk factor for diabetes.  Breast cancer screening is essential preventive care for women. You should practice "breast self-awareness."  This means understanding the normal appearance and feel of your breasts and may include breast self-examination. Any changes detected, no matter how small, should be reported to a health care provider. Women in their 51s and 30s should have a clinical breast exam (CBE) by a health care provider as part of a regular health exam every 1 to 3 years. After age 3, women should have a CBE every year. Starting at age 40, women should consider having a mammogram (breast X-ray test) every year. Women who have a family history of breast cancer should talk to their health care provider about genetic screening. Women at a high risk of breast cancer should talk to their health care providers about having an MRI and a mammogram every year.  Breast cancer gene (BRCA)-related cancer risk assessment is recommended for women who have family members with BRCA-related cancers. BRCA-related cancers include breast, ovarian, tubal, and peritoneal cancers. Having family members with these cancers may be associated with an increased risk for harmful changes (mutations) in the breast cancer genes BRCA1 and BRCA2. Results of the assessment will determine the need for genetic counseling and BRCA1 and BRCA2 testing.  Routine pelvic exams to screen for cancer are no longer recommended for nonpregnant women who are considered low risk for cancer of the pelvic organs (ovaries, uterus, and vagina) and who do not have symptoms. Ask your health care provider if a screening pelvic exam is right for you.  If you have had past treatment for cervical cancer or a condition that could lead to cancer, you need Pap tests and screening for cancer for at least 20 years after your treatment. If Pap tests have been discontinued, your risk factors (such as having a new sexual partner) need to be reassessed to determine if screening should be resumed. Some women have medical problems that increase the chance of getting cervical cancer. In these cases, your  health care provider may recommend more frequent screening and Pap tests.  Colorectal cancer can be detected and often prevented. Most routine colorectal cancer screening begins at the age of 61 years and continues through age 26 years. However, your health care provider may recommend screening at an earlier age if you have risk factors for colon cancer. On a yearly basis, your health care provider may provide home test kits to check  for hidden blood in the stool. Use of a small camera at the end of a tube, to directly examine the colon (sigmoidoscopy or colonoscopy), can detect the earliest forms of colorectal cancer. Talk to your health care provider about this at age 50, when routine screening begins.  Direct exam of the colon should be repeated every 5-10 years through age 75 years, unless early forms of pre-cancerous polyps or small growths are found.  Hepatitis C blood testing is recommended for all people born from 1945 through 1965 and any individual with known risks for hepatitis C.  Pra  Osteoporosis is a disease in which the bones lose minerals and strength with aging. This can result in serious bone fractures or breaks. The risk of osteoporosis can be identified using a bone density scan. Women ages 65 years and over and women at risk for fractures or osteoporosis should discuss screening with their health care providers. Ask your health care provider whether you should take a calcium supplement or vitamin D to reduce the rate of osteoporosis.  Menopause can be associated with physical symptoms and risks. Hormone replacement therapy is available to decrease symptoms and risks. You should talk to your health care provider about whether hormone replacement therapy is right for you.  Use sunscreen. Apply sunscreen liberally and repeatedly throughout the day. You should seek shade when your shadow is shorter than you. Protect yourself by wearing long sleeves, pants, a wide-brimmed hat, and  sunglasses year round, whenever you are outdoors.  Once a month, do a whole body skin exam, using a mirror to look at the skin on your back. Tell your health care provider of new moles, moles that have irregular borders, moles that are larger than a pencil eraser, or moles that have changed in shape or color.  Stay current with required vaccines (immunizations).  Influenza vaccine. All adults should be immunized every year.  Tetanus, diphtheria, and acellular pertussis (Td, Tdap) vaccine. Pregnant women should receive 1 dose of Tdap vaccine during each pregnancy. The dose should be obtained regardless of the length of time since the last dose. Immunization is preferred during the 27th-36th week of gestation. An adult who has not previously received Tdap or who does not know her vaccine status should receive 1 dose of Tdap. This initial dose should be followed by tetanus and diphtheria toxoids (Td) booster doses every 10 years. Adults with an unknown or incomplete history of completing a 3-dose immunization series with Td-containing vaccines should begin or complete a primary immunization series including a Tdap dose. Adults should receive a Td booster every 10 years.  Varicella vaccine. An adult without evidence of immunity to varicella should receive 2 doses or a second dose if she has previously received 1 dose. Pregnant females who do not have evidence of immunity should receive the first dose after pregnancy. This first dose should be obtained before leaving the health care facility. The second dose should be obtained 4-8 weeks after the first dose.  Human papillomavirus (HPV) vaccine. Females aged 13-26 years who have not received the vaccine previously should obtain the 3-dose series. The vaccine is not recommended for use in pregnant females. However, pregnancy testing is not needed before receiving a dose. If a female is found to be pregnant after receiving a dose, no treatment is needed. In that  case, the remaining doses should be delayed until after the pregnancy. Immunization is recommended for any person with an immunocompromised condition through the age of 26 years   if she did not get any or all doses earlier. During the 3-dose series, the second dose should be obtained 4-8 weeks after the first dose. The third dose should be obtained 24 weeks after the first dose and 16 weeks after the second dose.  Zoster vaccine. One dose is recommended for adults aged 60 years or older unless certain conditions are present.  Measles, mumps, and rubella (MMR) vaccine. Adults born before 1957 generally are considered immune to measles and mumps. Adults born in 1957 or later should have 1 or more doses of MMR vaccine unless there is a contraindication to the vaccine or there is laboratory evidence of immunity to each of the three diseases. A routine second dose of MMR vaccine should be obtained at least 28 days after the first dose for students attending postsecondary schools, health care workers, or international travelers. People who received inactivated measles vaccine or an unknown type of measles vaccine during 1963-1967 should receive 2 doses of MMR vaccine. People who received inactivated mumps vaccine or an unknown type of mumps vaccine before 1979 and are at high risk for mumps infection should consider immunization with 2 doses of MMR vaccine. For females of childbearing age, rubella immunity should be determined. If there is no evidence of immunity, females who are not pregnant should be vaccinated. If there is no evidence of immunity, females who are pregnant should delay immunization until after pregnancy. Unvaccinated health care workers born before 1957 who lack laboratory evidence of measles, mumps, or rubella immunity or laboratory confirmation of disease should consider measles and mumps immunization with 2 doses of MMR vaccine or rubella immunization with 1 dose of MMR vaccine.  Pneumococcal  13-valent conjugate (PCV13) vaccine. When indicated, a person who is uncertain of her immunization history and has no record of immunization should receive the PCV13 vaccine. An adult aged 19 years or older who has certain medical conditions and has not been previously immunized should receive 1 dose of PCV13 vaccine. This PCV13 should be followed with a dose of pneumococcal polysaccharide (PPSV23) vaccine. The PPSV23 vaccine dose should be obtained at least 1 or more year(s) after the dose of PCV13 vaccine. An adult aged 19 years or older who has certain medical conditions and previously received 1 or more doses of PPSV23 vaccine should receive 1 dose of PCV13. The PCV13 vaccine dose should be obtained 1 or more years after the last PPSV23 vaccine dose.    Pneumococcal polysaccharide (PPSV23) vaccine. When PCV13 is also indicated, PCV13 should be obtained first. All adults aged 65 years and older should be immunized. An adult younger than age 65 years who has certain medical conditions should be immunized. Any person who resides in a nursing home or long-term care facility should be immunized. An adult smoker should be immunized. People with an immunocompromised condition and certain other conditions should receive both PCV13 and PPSV23 vaccines. People with human immunodeficiency virus (HIV) infection should be immunized as soon as possible after diagnosis. Immunization during chemotherapy or radiation therapy should be avoided. Routine use of PPSV23 vaccine is not recommended for American Indians, [place] Natives, or people younger than 65 years unless there are medical conditions that require PPSV23 vaccine. When indicated, people who have unknown immunization and have no record of immunization should receive PPSV23 vaccine. One-time revaccination 5 years after the first dose of PPSV23 is recommended for people aged 19-64 years who have chronic kidney failure, nephrotic syndrome, asplenia, or  immunocompromised conditions. People who received 1-2   doses of PPSV23 before age 65 years should receive another dose of PPSV23 vaccine at age 65 years or later if at least 5 years have passed since the previous dose. Doses of PPSV23 are not needed for people immunized with PPSV23 at or after age 65 years.  Preventive Services / Frequency   Ages 40 to 64 years  Blood pressure check.  Lipid and cholesterol check.  Lung cancer screening. / Every year if you are aged 55-80 years and have a 30-pack-year history of smoking and currently smoke or have quit within the past 15 years. Yearly screening is stopped once you have quit smoking for at least 15 years or develop a health problem that would prevent you from having lung cancer treatment.  Clinical breast exam.** / Every year after age 40 years.   BRCA-related cancer risk assessment.** / For women who have family members with a BRCA-related cancer (breast, ovarian, tubal, or peritoneal cancers).  Mammogram.** / Every year beginning at age 40 years and continuing for as long as you are in good health. Consult with your health care provider.  Pap test.** / Every 3 years starting at age 30 years through age 65 or 70 years with a history of 3 consecutive normal Pap tests.  HPV screening.** / Every 3 years from ages 30 years through ages 65 to 70 years with a history of 3 consecutive normal Pap tests.  Fecal occult blood test (FOBT) of stool. / Every year beginning at age 50 years and continuing until age 75 years. You may not need to do this test if you get a colonoscopy every 10 years.  Flexible sigmoidoscopy or colonoscopy.** / Every 5 years for a flexible sigmoidoscopy or every 10 years for a colonoscopy beginning at age 50 years and continuing until age 75 years.  Hepatitis C blood test.** / For all people born from 1945 through 1965 and any individual with known risks for hepatitis C.  Skin self-exam. / Monthly.  Influenza vaccine. /  Every year.  Tetanus, diphtheria, and acellular pertussis (Tdap/Td) vaccine.** / Consult your health care provider. Pregnant women should receive 1 dose of Tdap vaccine during each pregnancy. 1 dose of Td every 10 years.  Varicella vaccine.** / Consult your health care provider. Pregnant females who do not have evidence of immunity should receive the first dose after pregnancy.  Zoster vaccine.** / 1 dose for adults aged 60 years or older.  Pneumococcal 13-valent conjugate (PCV13) vaccine.** / Consult your health care provider.  Pneumococcal polysaccharide (PPSV23) vaccine.** / 1 to 2 doses if you smoke cigarettes or if you have certain conditions.  Meningococcal vaccine.** / Consult your health care provider.  Hepatitis A vaccine.** / Consult your health care provider.  Hepatitis B vaccine.** / Consult your health care provider. Screening for abdominal aortic aneurysm (AAA)  by ultrasound is recommended for people over 50 who have history of high blood pressure or who are current or former smokers. ++++++++++++++++++ Recommend Adult Low Dose Aspirin or  coated  Aspirin 81 mg daily  To reduce risk of Colon Cancer 40 %,  Skin Cancer 26 % ,  Melanoma 46%  and  Pancreatic cancer 60% +++++++++++++++++++ Vitamin D goal  is between 70-100.  Please make sure that you are taking your Vitamin D as directed.  It is very important as a natural anti-inflammatory  helping hair, skin, and nails, as well as reducing stroke and heart attack risk.  It helps your bones and helps with mood. It   also decreases numerous cancer risks so please take it as directed.  Low Vit D is associated with a 200-300% higher risk for CANCER  and 200-300% higher risk for HEART   ATTACK  &  STROKE.   .....................................Marland Kitchen It is also associated with higher death rate at younger ages,  autoimmune diseases like Rheumatoid arthritis, Lupus, Multiple Sclerosis.    Also many other serious conditions, like  depression, Alzheimer's Dementia, infertility, muscle aches, fatigue, fibromyalgia - just to name a few. ++++++++++++++++++ Recommend the book "The END of DIETING" by Dr Excell Seltzer  & the book "The END of DIABETES " by Dr Excell Seltzer At [email or web address] - get book & Audio CD's    Being diabetic has a  300% increased risk for heart attack, stroke, cancer, and alzheimer- type vascular dementia. It is very important that you work harder with diet by avoiding all foods that are white. Avoid white rice (brown & wild rice is OK), white potatoes (sweetpotatoes in moderation is OK), White bread or wheat bread or anything made out of white flour like bagels, donuts, rolls, buns, biscuits, cakes, pastries, cookies, pizza crust, and pasta (made from white flour & egg whites) - vegetarian pasta or spinach or wheat pasta is OK. Multigrain breads like Arnold's or [employer], or multigrain sandwich thins or flatbreads.  Diet, exercise and weight loss can reverse and cure diabetes in the early stages.  Diet, exercise and weight loss is very important in the control and prevention of complications of diabetes which affects every system in your body, ie. Brain - dementia/stroke, eyes - glaucoma/blindness, heart - heart attack/heart failure, kidneys - dialysis, stomach - gastric paralysis, intestines - malabsorption, nerves - severe painful neuritis, circulation - gangrene & loss of a leg(s), and finally cancer and Alzheimers.    I recommend avoid fried & greasy foods,  sweets/candy, white rice (brown or wild rice or Quinoa is OK), white potatoes (sweet potatoes are OK) - anything made from white flour - bagels, doughnuts, rolls, buns, biscuits,white and wheat breads, pizza crust and traditional pasta made of white flour & egg white(vegetarian pasta or spinach or wheat pasta is OK).  Multi-grain bread is OK - like multi-grain flat bread or sandwich thins. Avoid alcohol in excess. Exercise is also important.    Eat all the  vegetables you want - avoid meat, especially red meat and dairy - especially cheese.  Cheese is the most concentrated form of trans-fats which is the worst thing to clog up our arteries. Veggie cheese is OK which can be found in the fresh produce section at [employer] or [employer] or [employer]  ++++++++++++++++++++++ DASH Eating Plan  DASH stands for "Dietary Approaches to Stop Hypertension."   The DASH eating plan is a healthy eating plan that has been shown to reduce high blood pressure (hypertension). Additional health benefits may include reducing the risk of type 2 diabetes mellitus, heart disease, and stroke. The DASH eating plan may also help with weight loss. WHAT DO I NEED TO KNOW ABOUT THE DASH EATING PLAN? For the DASH eating plan, you will follow these general guidelines:  Choose foods with a percent daily value for sodium of less than 5% (as listed on the food label).  Use salt-free seasonings or herbs instead of table salt or sea salt.  Check with your health care provider or pharmacist before using salt substitutes.  Eat lower-sodium products, often labeled as "lower sodium" or "no salt added."  Eat fresh foods.  Eat  more vegetables, fruits, and low-fat dairy products.  Choose whole grains. Look for the word "whole" as the first word in the ingredient list.  Choose fish   Limit sweets, desserts, sugars, and sugary drinks.  Choose heart-healthy fats.  Eat veggie cheese   Eat more home-cooked food and less restaurant, buffet, and fast food.  Limit fried foods.  Cook foods using methods other than frying.  Limit canned vegetables. If you do use them, rinse them well to decrease the sodium.  When eating at a restaurant, ask that your food be prepared with less salt, or no salt if possible.                      WHAT FOODS CAN I EAT? Read Dr Fara Olden Fuhrman's books on The End of Dieting & The End of Diabetes  Grains Whole grain or whole wheat bread. Brown  rice. Whole grain or whole wheat pasta. Quinoa, bulgur, and whole grain cereals. Low-sodium cereals. Corn or whole wheat flour tortillas. Whole grain cornbread. Whole grain crackers. Low-sodium crackers.  Vegetables Fresh or frozen vegetables (raw, steamed, roasted, or grilled). Low-sodium or reduced-sodium tomato and vegetable juices. Low-sodium or reduced-sodium tomato sauce and paste. Low-sodium or reduced-sodium canned vegetables.   Fruits All fresh, canned (in natural juice), or frozen fruits.  Protein Products  All fish and seafood.  Dried beans, peas, or lentils. Unsalted nuts and seeds. Unsalted canned beans.  Dairy Low-fat dairy products, such as skim or 1% milk, 2% or reduced-fat cheeses, low-fat ricotta or cottage cheese, or plain low-fat yogurt. Low-sodium or reduced-sodium cheeses.  Fats and Oils Tub margarines without trans fats. Light or reduced-fat mayonnaise and salad dressings (reduced sodium). Avocado. Safflower, olive, or canola oils. Natural peanut or almond butter.  Other Unsalted popcorn and pretzels. The items listed above may not be a complete list of recommended foods or beverages. Contact your dietitian for more options.  ++++++++++++++++++  WHAT FOODS ARE NOT RECOMMENDED? Grains/ White flour or wheat flour White bread. White pasta. White rice. Refined cornbread. Bagels and croissants. Crackers that contain trans fat.  Vegetables  Creamed or fried vegetables. Vegetables in a . Regular canned vegetables. Regular canned tomato sauce and paste. Regular tomato and vegetable juices.  Fruits Dried fruits. Canned fruit in light or heavy syrup. Fruit juice.  Meat and Other Protein Products Meat in general - RED meat & White meat.  Fatty cuts of meat. Ribs, chicken wings, all processed meats as bacon, sausage, bologna, salami, fatback, hot dogs, bratwurst and packaged luncheon meats.  Dairy Whole or 2% milk, cream, half-and-half, and cream cheese. Whole-fat or  sweetened yogurt. Full-fat cheeses or blue cheese. Non-dairy creamers and whipped toppings. Processed cheese, cheese spreads, or cheese curds.  Condiments Onion and garlic salt, seasoned salt, table salt, and sea salt. Canned and packaged gravies. Worcestershire sauce. Tartar sauce. Barbecue sauce. Teriyaki sauce. Soy sauce, including reduced sodium. Steak sauce. Fish sauce. Oyster sauce. Cocktail sauce. Horseradish. Ketchup and mustard. Meat flavorings and tenderizers. Bouillon cubes. Hot sauce. Tabasco sauce. Marinades. Taco seasonings. Relishes.  Fats and Oils Butter, stick margarine, lard, shortening and bacon fat. Coconut, palm kernel, or palm oils. Regular salad dressings.  Pickles and olives. Salted popcorn and pretzels.  The items listed above may not be a complete list of foods and beverages to avoid.

## 2018-12-14 NOTE — Progress Notes (Signed)
[facility]  Unk Pinto, M.D.     Uvaldo Bristle. Silverio Lay, P.A.-C Liane Comber, [place] [address] Telephone [phone] Telefax [phone] _______________________________________________ Annual Screening/Preventative Visit & Comprehensive Evaluation &  Examination      This very nice 58 y.o. MWF presents for a Screening /Preventative Visit & comprehensive evaluation and management of multiple medical co-morbidities.  Patient has been followed for HTN, HLD, Prediabetes  and Vitamin D Deficiency.      HTN predates circa 2010. Patient's BP has been controlled at home and patient denies any cardiac symptoms as chest pain, palpitations, shortness of breath, dizziness or ankle swelling. Today's BP is elevated at 168/100 & rechecked x 2 at 170/108.     Patient's hyperlipidemia is controlled with diet. Last lipids were at goal: Lab Results  Component Value Date   CHOL 108 08/09/2017   HDL 36 (L) 08/09/2017   LDLCALC 55 08/09/2017   TRIG 85 08/09/2017   CHOLHDL 3.0 08/09/2017      Patient has Moderate Obesity and is monitored proactively for glucose intolerance and patient denies reactive hypoglycemic symptoms, visual blurring, diabetic polys or paresthesias. Last A1c was Normal & at goal: Lab Results  Component Value Date   HGBA1C 5.1 08/09/2017   Wt Readings from Last 3 Encounters:  12/16/18 183 lb 3.2 oz (83.1 kg)  08/09/17 176 lb 3.2 oz (79.9 kg)  03/09/16 174 lb 3.2 oz (79 kg)       Finally, patient has history of Vitamin D Deficiency and last Vitamin D was at goal: Lab Results  Component Value Date   VD25OH 72 08/09/2017   Current Outpatient Medications on File Prior to Visit  Medication Sig  . bisoprolol-hydrochlorothiazide (ZIAC) 5-6.25 MG tablet Take 1 tablet Daily for BP  . buPROPion (WELLBUTRIN XL) 300 MG 24 hr tablet Take 1 tablet Daily for Mood  .  Cetirizine HCl (ZYRTEC ALLERGY PO) Take by mouth daily.  . Cholecalciferol (VITAMIN D PO) Take 2,000 Units by mouth 2 (two) times daily.  . COMBIPATCH 0.05-0.14 MG/DAY APPLY 1 PATCH TOPICALLY TO  SKIN TWICE WEEKLY  . Multiple Vitamins-Minerals (WOMENS MULTI PO) Take by mouth daily.  Marland Kitchen OVER THE COUNTER MEDICATION Aleve 2 tabs daily  . topiramate (TOPAMAX) 50 MG tablet Take 1 tablet 2 x /day at Suppertime & Bedtime for Dieting & Weight loss  . TURMERIC PO Take 1 tablet by mouth daily.  . vitamin C (ASCORBIC ACID) 500 MG tablet Take 500 mg by mouth daily.  Marland Kitchen dicyclomine (BENTYL) 20 MG tablet Take 1 tablet 3 x / day before meals for IBS  . montelukast (SINGULAIR) 10 MG tablet Take 1 tablet daily for allergies   No current facility-administered medications on file prior to visit.    No Known Allergies Past Medical History:  Diagnosis Date  . Allergy   . Hypertension    Health Maintenance  Topic Date Due  . INFLUENZA VACCINE  01/11/2019  . PAP SMEAR-Modifier  07/06/2020  . MAMMOGRAM  07/12/2020  . TETANUS/TDAP  04/22/2022  . COLONOSCOPY  12/24/2022  . Hepatitis C Screening  Completed  . HIV Screening  Completed   Immunization History  Administered Date(s) Administered  . Hepatitis B 06/12/1988  . Influenza-Unspecified 03/14/2016, 03/14/2017, 03/12/2018  . PPD Test 10/09/2013, 12/24/2014, 03/09/2016, 08/09/2017  . Td 04/22/2012   Last Colon - 12/16/2012 - Dr Arty Baumgartner - recc 10 yr f/o due July 2024  Last MGM - 07/12/2018  Past Surgical History:  Procedure Laterality Date  . BREAST SURGERY Bilateral 2009   Reduction  . OVARIAN CYST REMOVAL  2001  . REDUCTION MAMMAPLASTY Bilateral 2009   Family History  Problem Relation Age of Onset  . Hypertension Mother   . Hypertension Father    Social History   Tobacco Use  . Smoking status: Never Smoker  . Smokeless tobacco: Never Used  Substance Use Topics  . Alcohol use: No  . Drug use: No    ROS Constitutional: Denies  fever, chills, weight loss/gain, headaches, insomnia,  night sweats, and change in appetite. Does c/o fatigue. Eyes: Denies redness, blurred vision, diplopia, discharge, itchy, watery eyes.  ENT: Denies discharge, congestion, post nasal drip, epistaxis, sore throat, earache, hearing loss, dental pain, Tinnitus, Vertigo, Sinus pain, snoring.  Cardio: Denies chest pain, palpitations, irregular heartbeat, syncope, dyspnea, diaphoresis, orthopnea, PND, claudication, edema Respiratory: denies cough, dyspnea, DOE, pleurisy, hoarseness, laryngitis, wheezing.  Gastrointestinal: Denies dysphagia, heartburn, reflux, water brash, pain, cramps, nausea, vomiting, bloating, diarrhea, constipation, hematemesis, melena, hematochezia, jaundice, hemorrhoids Genitourinary: Denies dysuria, frequency, urgency, nocturia, hesitancy, discharge, hematuria, flank pain Breast: Breast lumps, nipple discharge, bleeding.  Musculoskeletal: Denies arthralgia, myalgia, stiffness, Jt. Swelling, pain, limp, and strain/sprain. Denies falls. Skin: Denies puritis, rash, hives, warts, acne, eczema, changing in skin lesion Neuro: No weakness, tremor, incoordination, spasms, paresthesia, pain Psychiatric: Denies confusion, memory loss, sensory loss. Denies Depression. Endocrine: Denies change in weight, skin, hair change, nocturia, and paresthesia, diabetic polys, visual blurring, hyper / hypo glycemic episodes.  Heme/Lymph: No excessive bleeding, bruising, enlarged lymph nodes.  Physical Exam  BP (!) 164/100   Pulse 64   Temp (!) 97.1 F (36.2 C)   Resp 16   Ht 5\' 4"  (1.626 m)   Wt 183 lb 3.2 oz (83.1 kg)   BMI 31.45 kg/m   General Appearance: Well nourished, well groomed and in no apparent distress.  Eyes: PERRLA, EOMs, conjunctiva no swelling or erythema, normal fundi and vessels. Sinuses: No frontal/maxillary tenderness ENT/Mouth: EACs patent / TMs  nl. Nares clear without erythema, swelling, mucoid exudates. Oral hygiene  is good. No erythema, swelling, or exudate. Tongue normal, non-obstructing. Tonsils not swollen or erythematous. Hearing normal.  Neck: Supple, thyroid not palpable. No bruits, nodes or JVD. Respiratory: Respiratory effort normal.  BS equal and clear bilateral without rales, rhonci, wheezing or stridor. Cardio: Heart sounds are normal with regular rate and rhythm and no murmurs, rubs or gallops. Peripheral pulses are normal and equal bilaterally without edema. No aortic or femoral bruits. Chest: symmetric with normal excursions and percussion. Breasts: Deferred to Gyn Abdomen: Flat, soft with bowel sounds active. Nontender, no guarding, rebound, hernias, masses, or organomegaly.  Lymphatics: Non tender without lymphadenopathy.  Genitourinary:  Musculoskeletal: Full ROM all peripheral extremities, joint stability, 5/5 strength, and normal gait. Skin: Warm and dry without rashes, lesions, cyanosis, clubbing or  ecchymosis.  Neuro: Cranial nerves intact, reflexes equal bilaterally. Normal muscle tone, no cerebellar symptoms. Sensation intact.  Pysch: Alert and oriented X 3, normal affect, Insight and Judgment appropriate.   Assessment and Plan  1. Annual Preventative Screening Examination  2. Essential hypertension  - EKG 12-Lead - [place], RETROPERITNL ABD,  LTD - CBC with Differential/Platelet - COMPLETE METABOLIC PANEL WITH GFR - Magnesium - TSH  - Advised increase her Ziac-5 to 2 tablets qam  - olmesartan (BENICAR) 40 MG tablet; Take 1 /2 to 1 tablet  at night for BP  Dispense: 90 tablet;  Refill: 3  3. Hyperlipidemia, mixed  - EKG 12-Lead - [place], RETROPERITNL ABD,  LTD - TSH - Lipid panel  4. Abnormal glucose  - EKG 12-Lead - [place], RETROPERITNL ABD,  LTD - Hemoglobin A1c - Insulin, random  5. Vitamin D deficiency  - VITAMIN D 25 Hydroxyl  6. Gastroesophageal reflux disease  - CBC with Differential/Platelet  7. Screening examination for pulmonary tuberculosis  - TB Skin  Test  8. Screening for colorectal cancer  - POC Hemoccult Bld/Stl  9. Screening for ischemic heart disease  - EKG 12-Lead  10. FH: hypertension  - EKG 12-Lead - [place], RETROPERITNL ABD,  LTD  11. Screening for AAA (aortic abdominal aneurysm)  - [place], RETROPERITNL ABD,  LTD  12. Other fatigue  - Urinalysis, Routine w reflex microscopic - Microalbumin / creatinine urine ratio - Iron,Total/Total Iron Binding Cap - Vitamin B12 - CBC with Differential/Platelet - TSH  13. Medication management  - CBC with Differential/Platelet - COMPLETE METABOLIC PANEL WITH GFR - Magnesium - TSH - Hemoglobin A1c - Insulin, random - VITAMIN D 25 Hydroxyl - Lipid panel      Patient was counseled in prudent diet to achieve/maintain BMI less than 25 for weight control, BP monitoring, regular exercise and medications. Discussed med's effects and SE's. Screening labs and tests as requested with regular follow-up as recommended. Over 40 minutes of exam, counseling, chart review and high complex critical decision making was performed.   Kirtland Bouchard, MD

## 2018-12-16 ENCOUNTER — Ambulatory Visit: Payer: 59 | Admitting: Internal Medicine

## 2018-12-16 ENCOUNTER — Other Ambulatory Visit: Payer: Self-pay

## 2018-12-16 VITALS — BP 164/100 | HR 64 | Temp 97.1°F | Resp 16 | Ht 64.0 in | Wt 183.2 lb

## 2018-12-16 DIAGNOSIS — Z79899 Other long term (current) drug therapy: Secondary | ICD-10-CM

## 2018-12-16 DIAGNOSIS — E782 Mixed hyperlipidemia: Secondary | ICD-10-CM

## 2018-12-16 DIAGNOSIS — Z111 Encounter for screening for respiratory tuberculosis: Secondary | ICD-10-CM

## 2018-12-16 DIAGNOSIS — Z136 Encounter for screening for cardiovascular disorders: Secondary | ICD-10-CM | POA: Diagnosis not present

## 2018-12-16 DIAGNOSIS — Z8249 Family history of ischemic heart disease and other diseases of the circulatory system: Secondary | ICD-10-CM

## 2018-12-16 DIAGNOSIS — Z1211 Encounter for screening for malignant neoplasm of colon: Secondary | ICD-10-CM

## 2018-12-16 DIAGNOSIS — R7309 Other abnormal glucose: Secondary | ICD-10-CM

## 2018-12-16 DIAGNOSIS — I10 Essential (primary) hypertension: Secondary | ICD-10-CM | POA: Diagnosis not present

## 2018-12-16 DIAGNOSIS — K219 Gastro-esophageal reflux disease without esophagitis: Secondary | ICD-10-CM

## 2018-12-16 DIAGNOSIS — E559 Vitamin D deficiency, unspecified: Secondary | ICD-10-CM

## 2018-12-16 DIAGNOSIS — Z0000 Encounter for general adult medical examination without abnormal findings: Secondary | ICD-10-CM | POA: Diagnosis not present

## 2018-12-16 DIAGNOSIS — R5383 Other fatigue: Secondary | ICD-10-CM

## 2018-12-16 DIAGNOSIS — Z0001 Encounter for general adult medical examination with abnormal findings: Secondary | ICD-10-CM

## 2018-12-16 MED ORDER — OLMESARTAN MEDOXOMIL 40 MG PO TABS: ORAL_TABLET | ORAL | 3 refills | Status: DC

## 2018-12-17 LAB — CBC WITH DIFFERENTIAL/PLATELET
Absolute Monocytes: 741 cells/uL (ref 200–950)
Basophils Absolute: 70 cells/uL (ref 0–200)
Basophils Relative: 0.9 %
Eosinophils Absolute: 351 cells/uL (ref 15–500)
Eosinophils Relative: 4.5 %
HCT: 45.7 % — ABNORMAL HIGH (ref 35.0–45.0)
Hemoglobin: 15.5 g/dL (ref 11.7–15.5)
Lymphs Abs: 2512 cells/uL (ref 850–3900)
MCH: 31.7 pg (ref 27.0–33.0)
MCHC: 33.9 g/dL (ref 32.0–36.0)
MCV: 93.5 fL (ref 80.0–100.0)
MPV: 13 fL — ABNORMAL HIGH (ref 7.5–12.5)
Monocytes Relative: 9.5 %
Neutro Abs: 4126 cells/uL (ref 1500–7800)
Neutrophils Relative %: 52.9 %
Platelets: 179 10*3/uL (ref 140–400)
RBC: 4.89 10*6/uL (ref 3.80–5.10)
RDW: 12.5 % (ref 11.0–15.0)
Total Lymphocyte: 32.2 %
WBC: 7.8 10*3/uL (ref 3.8–10.8)

## 2018-12-17 LAB — COMPLETE METABOLIC PANEL WITH GFR
AG Ratio: 2 (calc) (ref 1.0–2.5)
ALT: 17 U/L (ref 6–29)
AST: 17 U/L (ref 10–35)
Albumin: 4 g/dL (ref 3.6–5.1)
Alkaline phosphatase (APISO): 76 U/L (ref 37–153)
BUN: 23 mg/dL (ref 7–25)
CO2: 26 mmol/L (ref 20–32)
Calcium: 9.3 mg/dL (ref 8.6–10.4)
Chloride: 110 mmol/L (ref 98–110)
Creat: 0.72 mg/dL (ref 0.50–1.05)
GFR, Est African American: 108 mL/min/{1.73_m2} (ref >=60)
GFR, Est Non African American: 94 mL/min/{1.73_m2} (ref >=60)
Globulin: 2 g/dL (calc) (ref 1.9–3.7)
Glucose, Bld: 86 mg/dL (ref 65–99)
Potassium: 4.1 mmol/L (ref 3.5–5.3)
Sodium: 142 mmol/L (ref 135–146)
Total Bilirubin: 0.6 mg/dL (ref 0.2–1.2)
Total Protein: 6 g/dL — ABNORMAL LOW (ref 6.1–8.1)

## 2018-12-17 LAB — URINALYSIS, ROUTINE W REFLEX MICROSCOPIC
Bilirubin Urine: NEGATIVE
Glucose, UA: NEGATIVE
Hgb urine dipstick: NEGATIVE
Ketones, ur: NEGATIVE
Leukocytes,Ua: NEGATIVE
Nitrite: NEGATIVE
Protein, ur: NEGATIVE
Specific Gravity, Urine: 1.017 (ref 1.001–1.03)
pH: 6.5 (ref 5.0–8.0)

## 2018-12-17 LAB — MICROALBUMIN / CREATININE URINE RATIO
Creatinine, Urine: 100 mg/dL (ref 20–275)
Microalb Creat Ratio: 5 mcg/mg creat (ref <30)
Microalb, Ur: 0.5 mg/dL

## 2018-12-17 LAB — VITAMIN D 25 HYDROXY (VIT D DEFICIENCY, FRACTURES): Vit D, 25-Hydroxy: 64 ng/mL (ref 30–100)

## 2018-12-17 LAB — IRON, TOTAL/TOTAL IRON BINDING CAP
%SAT: 47 % (calc) — ABNORMAL HIGH (ref 16–45)
Iron: 131 ug/dL (ref 45–160)
TIBC: 278 mcg/dL (calc) (ref 250–450)

## 2018-12-17 LAB — TSH: TSH: 1.67 mIU/L (ref 0.40–4.50)

## 2018-12-17 LAB — LIPID PANEL
Cholesterol: 171 mg/dL (ref <200)
HDL: 45 mg/dL — ABNORMAL LOW (ref >=50)
LDL Cholesterol (Calc): 101 mg/dL (calc) — ABNORMAL HIGH
Non-HDL Cholesterol (Calc): 126 mg/dL (calc) (ref <130)
Total CHOL/HDL Ratio: 3.8 (calc) (ref <5.0)
Triglycerides: 146 mg/dL (ref <150)

## 2018-12-17 LAB — HEMOGLOBIN A1C
Hgb A1c MFr Bld: 5.3 % of total Hgb (ref <5.7)
Mean Plasma Glucose: 105 (calc)
eAG (mmol/L): 5.8 (calc)

## 2018-12-17 LAB — MAGNESIUM: Magnesium: 2.1 mg/dL (ref 1.5–2.5)

## 2018-12-17 LAB — INSULIN, RANDOM: Insulin: 10.6 u[IU]/mL

## 2018-12-17 LAB — VITAMIN B12: Vitamin B-12: 381 pg/mL (ref 200–1100)

## 2018-12-18 LAB — TB SKIN TEST
Induration: 0 mm
TB Skin Test: NEGATIVE

## 2019-01-21 ENCOUNTER — Other Ambulatory Visit: Payer: Self-pay

## 2019-01-21 DIAGNOSIS — Z1211 Encounter for screening for malignant neoplasm of colon: Secondary | ICD-10-CM

## 2019-01-21 LAB — POC HEMOCCULT BLD/STL (HOME/3-CARD/SCREEN)
Card #2 Fecal Occult Blod, POC: NEGATIVE
Card #3 Fecal Occult Blood, POC: NEGATIVE
Fecal Occult Blood, POC: NEGATIVE

## 2019-01-22 ENCOUNTER — Other Ambulatory Visit: Payer: Self-pay

## 2019-01-22 ENCOUNTER — Ambulatory Visit: Payer: 59 | Admitting: *Deleted

## 2019-01-22 VITALS — BP 126/76 | HR 72 | Temp 97.9°F | Resp 16 | Ht 64.0 in | Wt 183.2 lb

## 2019-01-22 DIAGNOSIS — I10 Essential (primary) hypertension: Secondary | ICD-10-CM

## 2019-01-22 NOTE — Progress Notes (Signed)
Patient is here for a NV to recheck her blood pressure. She has started the Olmesartan 40 mg 1/2 tablet at bedtime.  She was advised to increase her Bisoprolol/HCTZ 5-6.25 mg to 2 tablets daily.  She states she tried the increase, but it caused fatigue.  She reduced back to 1 tablet daily and monitored her BP, which was good. Today's BP is 126/76.

## 2019-01-23 ENCOUNTER — Other Ambulatory Visit: Payer: Self-pay | Admitting: Internal Medicine

## 2019-01-23 MED ORDER — ESTRADIOL 10 MCG VA TABS: ORAL_TABLET | VAGINAL | 1 refills | Status: DC

## 2019-01-23 MED ORDER — ORTHO-NOVUM 1/35 (28) 1-35 MG-MCG PO TABS: 1.0000 | ORAL_TABLET | Freq: Every day | ORAL | 3 refills | Status: DC

## 2019-03-04 ENCOUNTER — Other Ambulatory Visit: Payer: Self-pay | Admitting: Physician Assistant

## 2019-03-04 ENCOUNTER — Other Ambulatory Visit: Payer: Self-pay | Admitting: Internal Medicine

## 2019-03-04 DIAGNOSIS — I10 Essential (primary) hypertension: Secondary | ICD-10-CM

## 2019-03-04 MED ORDER — OLMESARTAN MEDOXOMIL 40 MG PO TABS: ORAL_TABLET | ORAL | 3 refills | Status: DC

## 2019-03-06 ENCOUNTER — Other Ambulatory Visit: Payer: Self-pay | Admitting: Internal Medicine

## 2019-03-06 DIAGNOSIS — I10 Essential (primary) hypertension: Secondary | ICD-10-CM

## 2019-03-06 MED ORDER — OLMESARTAN MEDOXOMIL 40 MG PO TABS: ORAL_TABLET | ORAL | 3 refills | Status: DC

## 2019-04-17 ENCOUNTER — Other Ambulatory Visit: Payer: Self-pay | Admitting: Internal Medicine

## 2019-04-17 MED ORDER — ALPRAZOLAM 0.5 MG PO TABS: ORAL_TABLET | ORAL | 0 refills | Status: AC

## 2019-04-26 ENCOUNTER — Other Ambulatory Visit: Payer: Self-pay | Admitting: Internal Medicine

## 2019-05-14 ENCOUNTER — Other Ambulatory Visit: Payer: Self-pay | Admitting: Internal Medicine

## 2019-05-14 ENCOUNTER — Other Ambulatory Visit: Payer: Self-pay

## 2019-05-14 MED ORDER — BUPROPION HCL ER (XL) 300 MG PO TB24: ORAL_TABLET | ORAL | 3 refills | Status: DC

## 2019-05-14 NOTE — Progress Notes (Signed)
Bupropion refill needs to be sent to [facility] instead of [facility]. New rx sent to local pharmacy.

## 2019-08-11 ENCOUNTER — Other Ambulatory Visit: Payer: Self-pay | Admitting: Internal Medicine

## 2019-08-26 ENCOUNTER — Other Ambulatory Visit: Payer: Self-pay | Admitting: Internal Medicine

## 2019-08-26 DIAGNOSIS — Z1231 Encounter for screening mammogram for malignant neoplasm of breast: Secondary | ICD-10-CM

## 2019-09-03 ENCOUNTER — Ambulatory Visit
Admission: RE | Admit: 2019-09-03 | Discharge: 2019-09-03 | Disposition: A | Discharge status: Home / Self Care | Payer: No Typology Code available for payment source | Source: Ambulatory Visit | Attending: Internal Medicine | Admitting: Internal Medicine

## 2019-09-03 ENCOUNTER — Other Ambulatory Visit: Payer: Self-pay

## 2019-09-03 DIAGNOSIS — Z1231 Encounter for screening mammogram for malignant neoplasm of breast: Secondary | ICD-10-CM

## 2019-09-21 ENCOUNTER — Other Ambulatory Visit: Payer: Self-pay | Admitting: Internal Medicine

## 2019-09-21 DIAGNOSIS — I10 Essential (primary) hypertension: Secondary | ICD-10-CM

## 2019-10-28 IMAGING — MG 2D DIGITAL SCREENING BILATERAL MAMMOGRAM WITH 3D TOMO WITH CAD
8 of 13 series · 8 of 29 positions shown · non-contrast
Comparison: Previous exam(s).

CLINICAL DATA: Screening.

EXAM:
2D DIGITAL SCREENING BILATERAL MAMMOGRAM WITH 3D TOMO WITH CAD

[L MLO (1 of 2)]
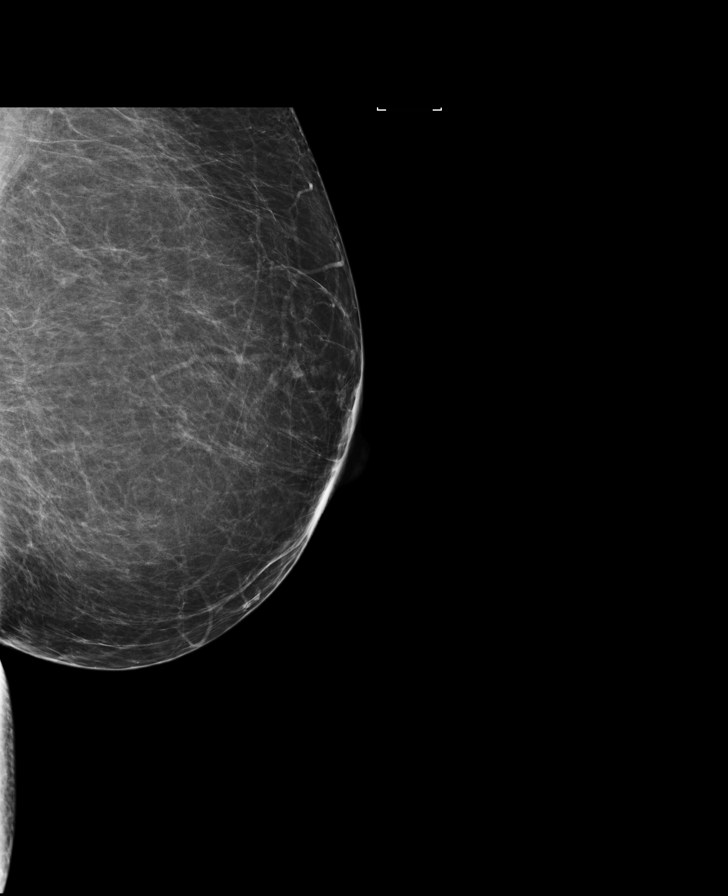

[R MLO]
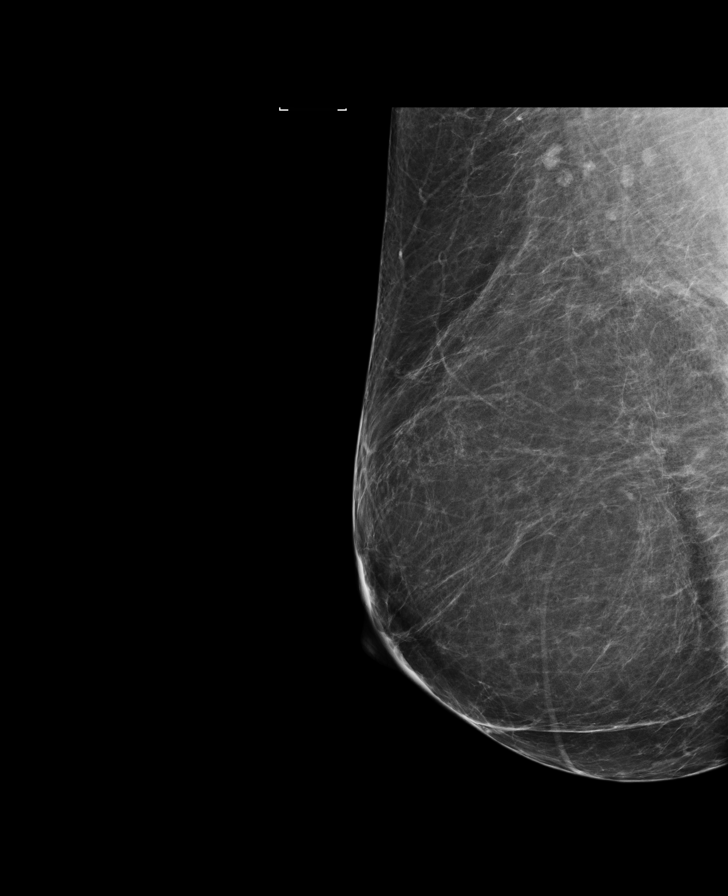

[L CC synth-2D]
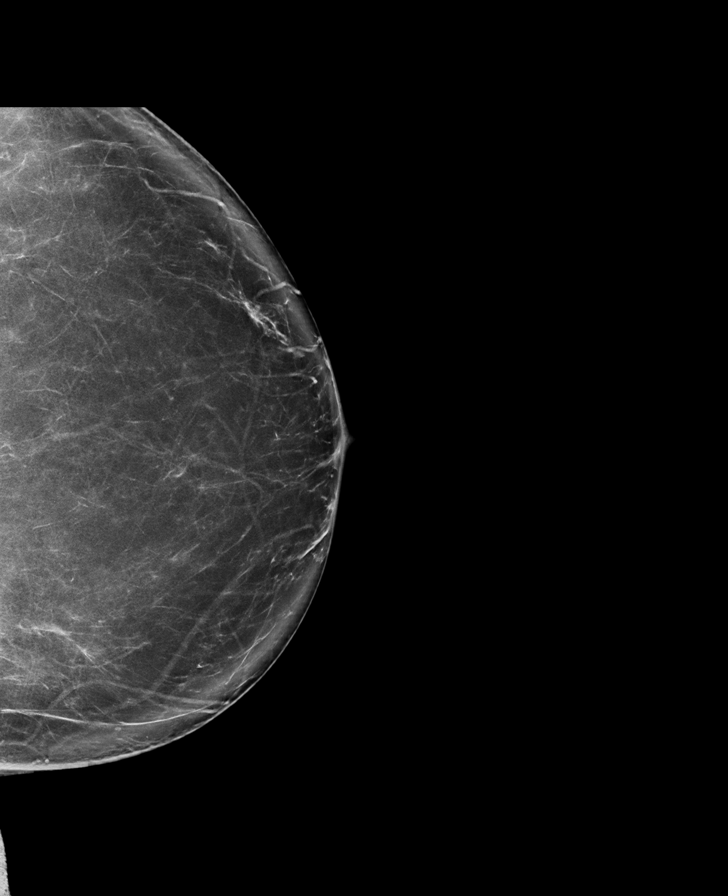

[L CC]
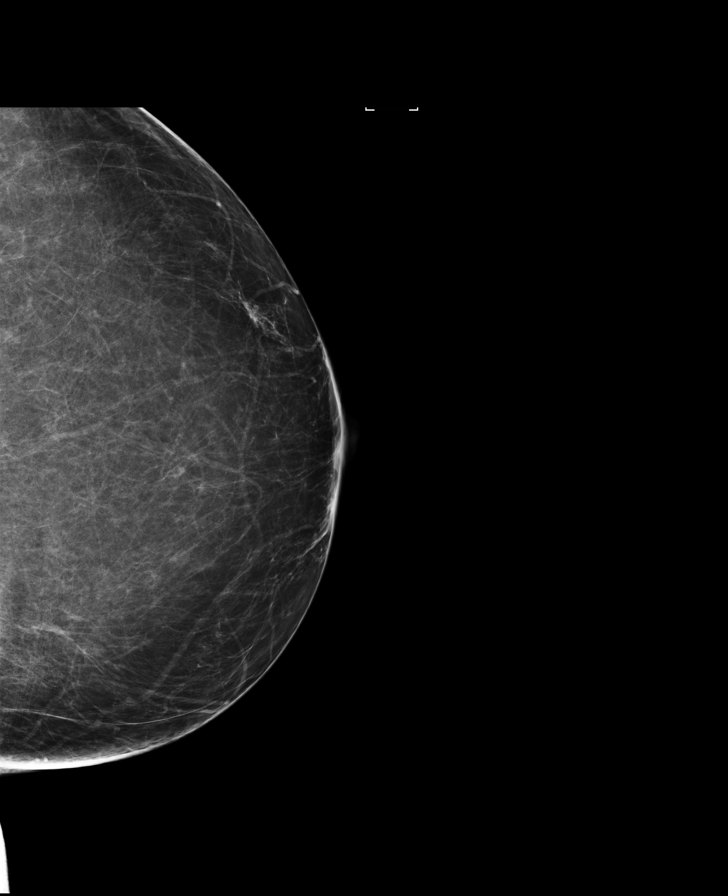

[R MLO synth-2D]
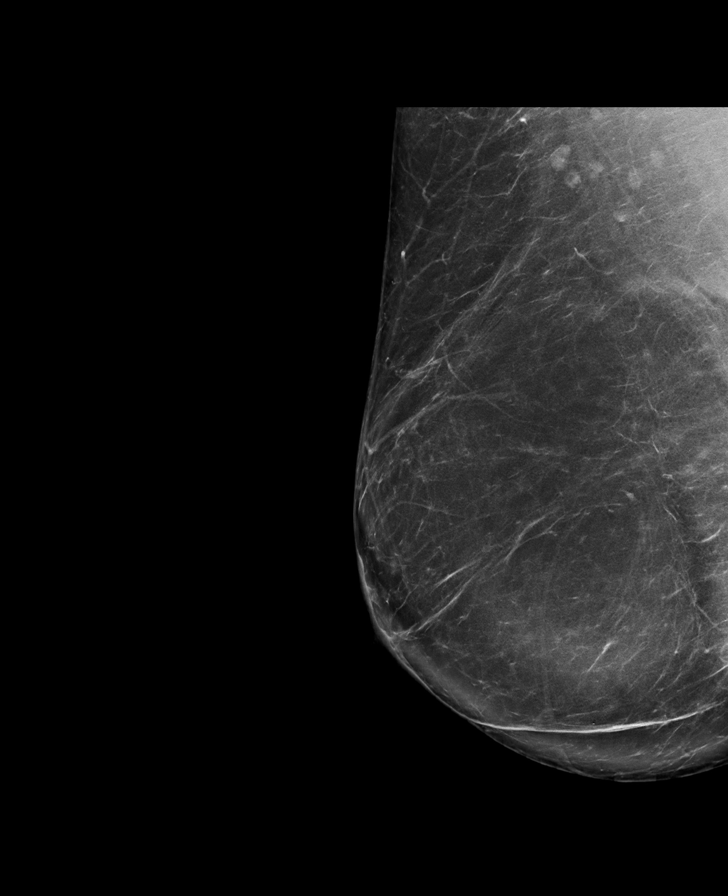

[L MLO (2 of 2)]
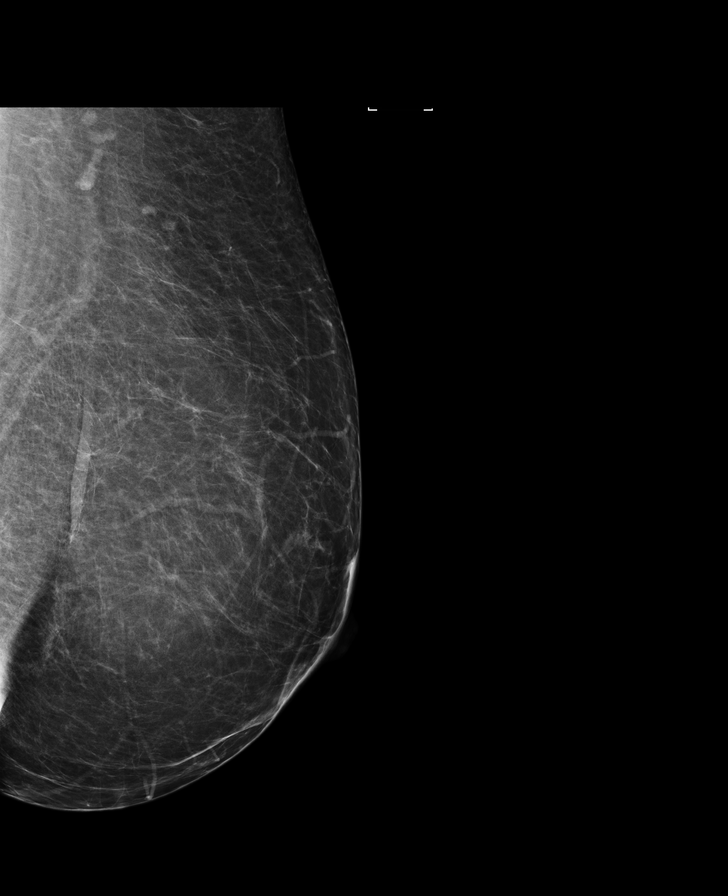

[R CC]
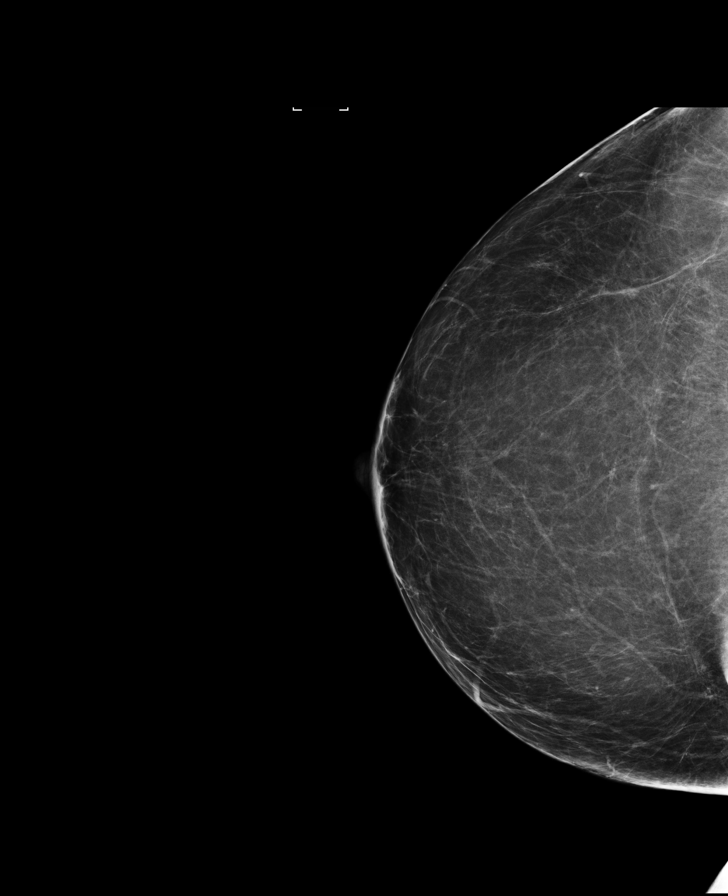

[R CC synth-2D]
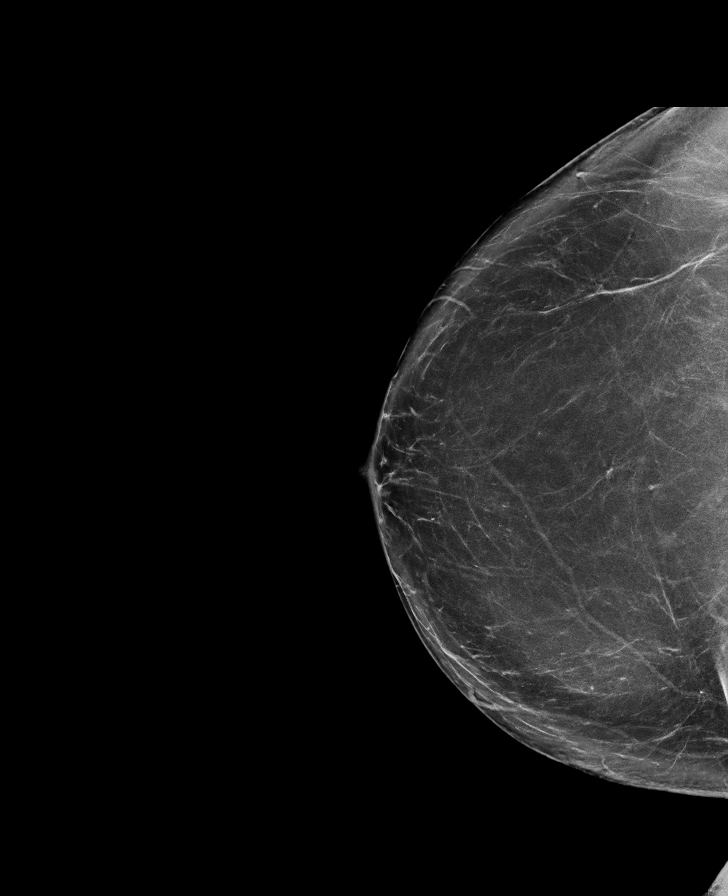

[8 of 29 positions shown; findings below may reference images not displayed]

ACR Breast Density Category b: There are scattered areas of
fibroglandular density.
FINDINGS: There are no findings suspicious for malignancy. Images were
processed with CAD.
IMPRESSION: No mammographic evidence of malignancy. A result letter of this
screening mammogram will be mailed directly to the patient.

RECOMMENDATION:
Screening mammogram in one year. (Code:[id number])

BI-RADS CATEGORY  1: Negative.

## 2019-11-04 ENCOUNTER — Other Ambulatory Visit: Payer: Self-pay | Admitting: Internal Medicine

## 2019-11-04 DIAGNOSIS — I10 Essential (primary) hypertension: Secondary | ICD-10-CM

## 2019-11-17 ENCOUNTER — Encounter: Payer: 59 | Admitting: Internal Medicine

## 2019-12-18 ENCOUNTER — Encounter: Payer: 59 | Admitting: Internal Medicine

## 2019-12-29 ENCOUNTER — Encounter: Payer: 59 | Admitting: Internal Medicine

## 2020-01-27 ENCOUNTER — Other Ambulatory Visit: Payer: Self-pay | Admitting: Internal Medicine

## 2020-03-29 NOTE — Progress Notes (Signed)
Complete Physical  Assessment and Plan:  Donna Chapman was seen today for annual exam.  Diagnoses and all orders for this visit:  Encounter for routine adult health examination without abnormal findings Get mammogram, ask insurance about shingrix, get flu vaccine at work   Essential hypertension Continue medication Monitor blood pressure at home; call if consistently over 130/80 Continue DASH diet.   Reminder to go to the ER if any CP, SOB, nausea, dizziness, severe HA, changes vision/speech, left arm numbness and tingling and jaw pain. -     CBC with Differential/Platelet -     COMPLETE METABOLIC PANEL WITH GFR -     Magnesium -     TSH -     Urinalysis, Routine w reflex microscopic -     Microalbumin / creatinine urine ratio -     EKG 12-Lead  Mixed hyperlipidemia -     Lipid panel -     TSH  Abnormal glucose -     COMPLETE METABOLIC PANEL WITH GFR -     Hemoglobin A1c -     Urinalysis, Routine w reflex microscopic -     Microalbumin / creatinine urine ratio  Vitamin D deficiency -     VITAMIN D 25 Hydroxy (Vit-D Deficiency, Fractures)  B12 deficiency -     Vitamin B12  Major depressive disorder with single episode, in partial remission ([place]) Continue medications; she plans to initiate counseling  Lifestyle discussed: diet/exerise, sleep hygiene, stress management, hydration  Obesity (BMI 30.0-34.9) Long discussion about weight loss, diet, and exercise Recommended diet heavy in fruits and veggies and low in animal meats, cheeses, and dairy products, appropriate calorie intake Patient will work on increasing exercise, less junk food Discussed appropriate weight for height  Follow up at next visit  Discussed med's effects and SE's. Screening labs and tests as requested with regular follow-up as recommended. Over 40 minutes of exam, counseling, chart review, and complex, high level critical decision making was performed this visit.   Future Appointments  Date Time  Provider [place]  10/06/2020  3:30 PM Liane Comber, NP [facility] None  03/31/2021  3:00 PM Liane Comber, NP [facility] None     HPI  59 y.o. female  presents for a complete physical and follow up for has Essential hypertension; Mixed hyperlipidemia; Vitamin D deficiency; Abnormal glucose; Medication management; Major depressive disorder with single episode, in partial remission ([place]); B12 deficiency; and Obesity (BMI 30.0-34.9) on their problem list.   She is married, no children, dog passed last year. 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   She had covid 19 in Jan 2021, reports has residual fatigue, also working new job on her feet, also shares tearfully both parents passed away, has been busy settling affairs. She has been on wellbutrin 300 mg with significant benefit for depression, rare use of xanax. Sleeps fairly.She feels is doing well. Discussed trying counseling via benefits with work.    Formerly established with [facility], has uterus, menopause, has been on HRT since until recently, transitioned to vaginal estrogen for dryness and doing well.   BMI is Body mass index is 31.25 kg/m., she has not been working on diet and exercise. On her feet all day at work. Admits to poor diet in the last year. Could work on increasing veggies/fruits. No alcohol. 1 cup coffee, some iced tea, 1 diet soda (working to stop).  Wt Readings from Last 3 Encounters:  03/31/20 176 lb 6.4 oz (80 kg)  01/22/19 183  lb 3.2 oz (83.1 kg)  12/16/18 183 lb 3.2 oz (83.1 kg)   Her blood pressure has been controlled at home, today their BP is BP: 110/72 She does not workout. She denies chest pain, shortness of breath, dizziness.   She is not on cholesterol medication and denies myalgias. Her cholesterol is not at goal. The cholesterol last visit was:   Lab Results  Component Value Date   CHOL 171 12/16/2018   HDL 45 (L) 12/16/2018   LDLCALC 101 (H) 12/16/2018   TRIG 146 12/16/2018   CHOLHDL 3.8  12/16/2018   She has been working on diet and exercise for glucose management. Last A1C in the office was:  Lab Results  Component Value Date   HGBA1C 5.3 12/16/2018   Last GFR: Lab Results  Component Value Date   GFRNONAA 94 12/16/2018   Patient is on Vitamin D supplement.   Lab Results  Component Value Date   VD25OH 64 12/16/2018     She has been taking B12 supplement  Lab Results  Component Value Date   VITAMINB12 381 12/16/2018     Current Medications:  Current Outpatient Medications on File Prior to Visit  Medication Sig Dispense Refill  . ALPRAZolam (XANAX) 0.5 MG tablet Take 1/2 to 1 tablet at Bedtime if needed for Sleep 30 tablet 0  . bisoprolol-hydrochlorothiazide (ZIAC) 5-6.25 MG tablet TAKE 1 TABLE DAILY FOR  BLOOD PRESSURE 90 tablet 3  . buPROPion (WELLBUTRIN XL) 300 MG 24 hr tablet Take 1 tablet Daily for Mood 90 tablet 3  . Cetirizine HCl (ZYRTEC ALLERGY PO) Take by mouth daily.    . Cholecalciferol (VITAMIN D PO) Take 2,000 Units by mouth 2 (two) times daily.    . Estradiol 10 MCG TABS vaginal tablet INSERT 1 TABLET VAGINALLY 2 TIMES PER WEEK 24 tablet 3  . montelukast (SINGULAIR) 10 MG tablet TAKE 1 TABLET DAILY FOR  ALLERGIES 90 tablet 3  . Multiple Vitamins-Minerals (WOMENS MULTI PO) Take by mouth daily.    Marland Kitchen OVER THE COUNTER MEDICATION Aleve 2 tabs daily    . TURMERIC PO Take 1 tablet by mouth daily.    . vitamin C (ASCORBIC ACID) 500 MG tablet Take 500 mg by mouth daily.    Marland Kitchen dicyclomine (BENTYL) 20 MG tablet Take 1 tablet 3 x / day before meals for IBS 270 tablet 1   No current facility-administered medications on file prior to visit.   Allergies:  No Known Allergies Medical History:  She has Essential hypertension; Mixed hyperlipidemia; Vitamin D deficiency; Abnormal glucose; Medication management; Major depressive disorder with single episode, in partial remission ([place]); B12 deficiency; and Obesity (BMI 30.0-34.9) on their problem list. Health  Maintenance:   Immunization History  Administered Date(s) Administered  . Hepatitis B 06/12/1988  . Influenza-Unspecified 03/14/2016, 03/14/2017, 03/12/2018  . PFIZER SARS-COV-2 Vaccination 11/03/2019, 11/24/2019  . PPD Test 10/09/2013, 12/24/2014, 03/09/2016, 08/09/2017, 12/16/2018  . Td 04/22/2012    Tetanus: 2013 Flu vaccine: will get at work  Shingrix: will check with insurance  Covid 19: 2/2, 2021, pfizer  LMP: postmenopausal  Pap: 07/06/2017 reported by patient at GYN, never abnormal, will do next here in 3-5 years MGM: 08/2019 DEXA: get age 69  Colonoscopy: 12/2012 normal Dr. Oletta Lamas EGD: n/a  Last Dental Exam: q14m Last Eye Exam: 02/2020, contacts/glasses Last derm: 2020 full skin check by derm  Patient Care Team: Unk Pinto, MD as PCP - General (Internal Medicine)  Surgical History:  She has a past surgical  history that includes Ovarian cyst removal (2001); Reduction mammaplasty (Bilateral, 2009); Foot surgery (Right, 2014); and Colonoscopy (2015). Family History:  Herfamily history includes Dementia (age of onset: 84) in her mother; Diabetes type II in her maternal grandmother and paternal grandfather; Hypertension in her father and mother; Lung cancer in her paternal grandmother; Vascular Disease in her father. Social History:  She reports that she has never smoked. She has never used smokeless tobacco. She reports that she does not drink alcohol and does not use drugs.  Review of Systems: Review of Systems  Constitutional: Negative for malaise/fatigue and weight loss.  HENT: Negative for hearing loss and tinnitus.   Eyes: Negative for blurred vision and double vision.  Respiratory: Negative for cough, sputum production, shortness of breath and wheezing.   Cardiovascular: Negative for chest pain, palpitations, orthopnea, claudication, leg swelling and PND.  Gastrointestinal: Negative for abdominal pain, blood in stool, constipation, diarrhea, heartburn, melena,  nausea and vomiting.  Genitourinary: Negative.   Musculoskeletal: Negative for joint pain and myalgias.  Skin: Negative for rash.  Neurological: Negative for dizziness, tingling, sensory change, weakness and headaches.  Endo/Heme/Allergies: Negative for polydipsia.  Psychiatric/Behavioral: Negative.  Negative for depression, memory loss, substance abuse and suicidal ideas. The patient is not nervous/anxious and does not have insomnia.   All other systems reviewed and are negative.   Physical Exam: Estimated body mass index is 31.25 kg/m as calculated from the following:   Height as of this encounter: 5\' 3"  (1.6 m).   Weight as of this encounter: 176 lb 6.4 oz (80 kg). BP 110/72   Pulse 66   Temp (!) 97.3 F (36.3 C)   Ht 5\' 3"  (1.6 m)   Wt 176 lb 6.4 oz (80 kg)   BMI 31.25 kg/m  General Appearance: Well nourished, in no apparent distress.  Eyes: PERRLA, EOMs, conjunctiva no swelling or erythema Sinuses: No Frontal/maxillary tenderness  ENT/Mouth: Ext aud canals clear, normal light reflex with TMs without erythema, bulging. Good dentition. No erythema, swelling, or exudate on post pharynx. Tonsils not swollen or erythematous. Hearing normal.  Neck: Supple, thyroid normal. No bruits  Respiratory: Respiratory effort normal, BS equal bilaterally without rales, rhonchi, wheezing or stridor.  Cardio: RRR without murmurs, rubs or gallops. Brisk peripheral pulses without edema.  Chest: symmetric, with normal excursions and percussion.  Breasts: Declines today, getting annual mammrams Abdomen: Soft, nontender, no guarding, rebound, hernias, masses, or organomegaly.  Lymphatics: Non tender without lymphadenopathy.  Genitourinary: defer Musculoskeletal: Full ROM all peripheral extremities,5/5 strength, and normal gait.  Skin: Warm, dry without rashes, lesions, ecchymosis. Neuro: Cranial nerves intact, reflexes equal bilaterally. Normal muscle tone, no cerebellar symptoms. Sensation intact.   Psych: Awake and oriented X 3, normal affect, Insight and Judgment appropriate.   EKG: WNL no ST changes.  Gorden Harms Jhayden Demuro 5:25 PM [facility]

## 2020-03-31 ENCOUNTER — Encounter: Payer: Self-pay | Admitting: Adult Health

## 2020-03-31 ENCOUNTER — Ambulatory Visit (INDEPENDENT_AMBULATORY_CARE_PROVIDER_SITE_OTHER): Payer: PRIVATE HEALTH INSURANCE | Admitting: Adult Health

## 2020-03-31 ENCOUNTER — Other Ambulatory Visit: Payer: Self-pay

## 2020-03-31 VITALS — BP 110/72 | HR 66 | Temp 97.3°F | Ht 63.0 in | Wt 176.4 lb

## 2020-03-31 DIAGNOSIS — Z136 Encounter for screening for cardiovascular disorders: Secondary | ICD-10-CM | POA: Diagnosis not present

## 2020-03-31 DIAGNOSIS — Z0000 Encounter for general adult medical examination without abnormal findings: Secondary | ICD-10-CM | POA: Diagnosis not present

## 2020-03-31 DIAGNOSIS — D649 Anemia, unspecified: Secondary | ICD-10-CM

## 2020-03-31 DIAGNOSIS — E669 Obesity, unspecified: Secondary | ICD-10-CM

## 2020-03-31 DIAGNOSIS — Z13220 Encounter for screening for lipoid disorders: Secondary | ICD-10-CM

## 2020-03-31 DIAGNOSIS — Z79899 Other long term (current) drug therapy: Secondary | ICD-10-CM

## 2020-03-31 DIAGNOSIS — Z130 Encounter for screening for diseases of the blood and blood-forming organs and certain disorders involving the immune mechanism: Secondary | ICD-10-CM

## 2020-03-31 DIAGNOSIS — I10 Essential (primary) hypertension: Secondary | ICD-10-CM

## 2020-03-31 DIAGNOSIS — Z131 Encounter for screening for diabetes mellitus: Secondary | ICD-10-CM

## 2020-03-31 DIAGNOSIS — F324 Major depressive disorder, single episode, in partial remission: Secondary | ICD-10-CM | POA: Insufficient documentation

## 2020-03-31 DIAGNOSIS — Z1389 Encounter for screening for other disorder: Secondary | ICD-10-CM

## 2020-03-31 DIAGNOSIS — R7309 Other abnormal glucose: Secondary | ICD-10-CM

## 2020-03-31 DIAGNOSIS — E782 Mixed hyperlipidemia: Secondary | ICD-10-CM

## 2020-03-31 DIAGNOSIS — E559 Vitamin D deficiency, unspecified: Secondary | ICD-10-CM

## 2020-03-31 DIAGNOSIS — E538 Deficiency of other specified B group vitamins: Secondary | ICD-10-CM | POA: Insufficient documentation

## 2020-03-31 DIAGNOSIS — Z1329 Encounter for screening for other suspected endocrine disorder: Secondary | ICD-10-CM

## 2020-03-31 MED ORDER — OLMESARTAN MEDOXOMIL 20 MG PO TABS: ORAL_TABLET | ORAL | 3 refills | Status: DC

## 2020-03-31 NOTE — Patient Instructions (Addendum)
  Ms. Vavrek , Thank you for taking time to come for your Annual Wellness Visit. I appreciate your ongoing commitment to your health goals. Please review the following plan we discussed and let me know if I can assist you in the future.   These are the goals we discussed: Goals    . DIET - EAT MORE FRUITS AND VEGETABLES     7+ servings daily     . DIET - INCREASE WATER INTAKE     65+ fluid ounces daily        This is a list of the screening recommended for you and due dates:  Health Maintenance  Topic Date Due  . Flu Shot  01/11/2020  . Pap Smear  07/06/2020  . Mammogram  09/02/2021  . Tetanus Vaccine  04/22/2022  . Colon Cancer Screening  12/24/2022  . COVID-19 Vaccine  Completed  .  Hepatitis C: One time screening is recommended by Center for Disease Control  (CDC) for  adults born from [birth year] through 1963.   Completed  . HIV Screening  Completed      Know what a healthy weight is for you (roughly BMI <25) and aim to maintain this  Aim for 7+ servings of fruits and vegetables daily  65-80+ fluid ounces of water or unsweet tea for healthy kidneys  Limit to max 1 drink of alcohol per day; avoid smoking/tobacco  Limit animal fats in diet for cholesterol and heart health - choose grass fed whenever available  Avoid highly processed foods, and foods high in saturated/trans fats  Aim for low stress - take time to unwind and care for your mental health  Aim for 150 min of moderate intensity exercise weekly for heart health, and weights twice weekly for bone health  Aim for 7-9 hours of sleep daily    A great goal to work towards is aiming to get in a serving daily of some of the most nutritionally dense foods - G- BOMBS daily

## 2020-04-01 ENCOUNTER — Other Ambulatory Visit: Payer: Self-pay | Admitting: Adult Health

## 2020-04-01 NOTE — Addendum Note (Signed)
Addended by: Izora Ribas on: 04/01/2020 08:05 AM   Modules accepted: Orders

## 2020-04-02 LAB — LIPID PANEL
Cholesterol: 209 mg/dL — ABNORMAL HIGH (ref <200)
HDL: 55 mg/dL (ref >=50)
LDL Cholesterol (Calc): 132 mg/dL (calc) — ABNORMAL HIGH
Non-HDL Cholesterol (Calc): 154 mg/dL (calc) — ABNORMAL HIGH (ref <130)
Total CHOL/HDL Ratio: 3.8 (calc) (ref <5.0)
Triglycerides: 114 mg/dL (ref <150)

## 2020-04-02 LAB — COMPLETE METABOLIC PANEL WITH GFR
AG Ratio: 1.9 (calc) (ref 1.0–2.5)
ALT: 26 U/L (ref 6–29)
AST: 24 U/L (ref 10–35)
Albumin: 4.3 g/dL (ref 3.6–5.1)
Alkaline phosphatase (APISO): 77 U/L (ref 37–153)
BUN: 24 mg/dL (ref 7–25)
CO2: 27 mmol/L (ref 20–32)
Calcium: 9.7 mg/dL (ref 8.6–10.4)
Chloride: 103 mmol/L (ref 98–110)
Creat: 0.81 mg/dL (ref 0.50–1.05)
GFR, Est African American: 93 mL/min/{1.73_m2} (ref >=60)
GFR, Est Non African American: 81 mL/min/{1.73_m2} (ref >=60)
Globulin: 2.3 g/dL (calc) (ref 1.9–3.7)
Glucose, Bld: 75 mg/dL (ref 65–99)
Potassium: 3.8 mmol/L (ref 3.5–5.3)
Sodium: 139 mmol/L (ref 135–146)
Total Bilirubin: 0.7 mg/dL (ref 0.2–1.2)
Total Protein: 6.6 g/dL (ref 6.1–8.1)

## 2020-04-02 LAB — URINALYSIS, ROUTINE W REFLEX MICROSCOPIC
Bilirubin Urine: NEGATIVE
Glucose, UA: NEGATIVE
Hgb urine dipstick: NEGATIVE
Ketones, ur: NEGATIVE
Leukocytes,Ua: NEGATIVE
Nitrite: NEGATIVE
Protein, ur: NEGATIVE
Specific Gravity, Urine: 1.013 (ref 1.001–1.03)
pH: 6 (ref 5.0–8.0)

## 2020-04-02 LAB — MICROALBUMIN / CREATININE URINE RATIO
Creatinine, Urine: 97 mg/dL (ref 20–275)
Microalb Creat Ratio: 8 mcg/mg creat (ref <30)
Microalb, Ur: 0.8 mg/dL

## 2020-04-02 LAB — CBC WITH DIFFERENTIAL/PLATELET
Absolute Monocytes: 869 cells/uL (ref 200–950)
Basophils Absolute: 74 cells/uL (ref 0–200)
Basophils Relative: 0.7 %
Eosinophils Absolute: 435 cells/uL (ref 15–500)
Eosinophils Relative: 4.1 %
HCT: 45.7 % — ABNORMAL HIGH (ref 35.0–45.0)
Hemoglobin: 15.8 g/dL — ABNORMAL HIGH (ref 11.7–15.5)
Lymphs Abs: 3731 cells/uL (ref 850–3900)
MCH: 32.3 pg (ref 27.0–33.0)
MCHC: 34.6 g/dL (ref 32.0–36.0)
MCV: 93.5 fL (ref 80.0–100.0)
MPV: 12.8 fL — ABNORMAL HIGH (ref 7.5–12.5)
Monocytes Relative: 8.2 %
Neutro Abs: 5491 cells/uL (ref 1500–7800)
Neutrophils Relative %: 51.8 %
Platelets: 197 10*3/uL (ref 140–400)
RBC: 4.89 10*6/uL (ref 3.80–5.10)
RDW: 11.8 % (ref 11.0–15.0)
Total Lymphocyte: 35.2 %
WBC: 10.6 10*3/uL (ref 3.8–10.8)

## 2020-04-02 LAB — VITAMIN B12: Vitamin B-12: 689 pg/mL (ref 200–1100)

## 2020-04-02 LAB — VITAMIN D 25 HYDROXY (VIT D DEFICIENCY, FRACTURES): Vit D, 25-Hydroxy: 79 ng/mL (ref 30–100)

## 2020-04-02 LAB — IRON,TIBC AND FERRITIN PANEL
%SAT: 32 % (calc) (ref 16–45)
Ferritin: 89 ng/mL (ref 16–232)
Iron: 107 ug/dL (ref 45–160)
TIBC: 337 mcg/dL (calc) (ref 250–450)

## 2020-04-02 LAB — TEST AUTHORIZATION

## 2020-04-02 LAB — TSH: TSH: 1.5 mIU/L (ref 0.40–4.50)

## 2020-04-02 LAB — HEMOGLOBIN A1C
Hgb A1c MFr Bld: 5.3 % of total Hgb (ref <5.7)
Mean Plasma Glucose: 105 (calc)
eAG (mmol/L): 5.8 (calc)

## 2020-04-02 LAB — MAGNESIUM: Magnesium: 2.1 mg/dL (ref 1.5–2.5)

## 2020-05-19 ENCOUNTER — Other Ambulatory Visit: Payer: Self-pay | Admitting: Internal Medicine

## 2020-08-15 ENCOUNTER — Other Ambulatory Visit: Payer: Self-pay | Admitting: Internal Medicine

## 2020-09-08 ENCOUNTER — Other Ambulatory Visit: Payer: Self-pay | Admitting: Internal Medicine

## 2020-09-08 DIAGNOSIS — Z1231 Encounter for screening mammogram for malignant neoplasm of breast: Secondary | ICD-10-CM

## 2020-09-28 ENCOUNTER — Other Ambulatory Visit: Payer: Self-pay | Admitting: *Deleted

## 2020-09-28 DIAGNOSIS — I10 Essential (primary) hypertension: Secondary | ICD-10-CM

## 2020-09-28 MED ORDER — OLMESARTAN MEDOXOMIL 20 MG PO TABS: ORAL_TABLET | ORAL | 3 refills | Status: DC

## 2020-10-06 ENCOUNTER — Encounter: Payer: Self-pay | Admitting: Adult Health

## 2020-10-06 ENCOUNTER — Other Ambulatory Visit: Payer: Self-pay

## 2020-10-06 ENCOUNTER — Ambulatory Visit (INDEPENDENT_AMBULATORY_CARE_PROVIDER_SITE_OTHER): Payer: PRIVATE HEALTH INSURANCE | Admitting: Adult Health

## 2020-10-06 VITALS — BP 160/100 | HR 66 | Temp 97.3°F | Wt 179.0 lb

## 2020-10-06 DIAGNOSIS — F324 Major depressive disorder, single episode, in partial remission: Secondary | ICD-10-CM

## 2020-10-06 DIAGNOSIS — I10 Essential (primary) hypertension: Secondary | ICD-10-CM

## 2020-10-06 DIAGNOSIS — E782 Mixed hyperlipidemia: Secondary | ICD-10-CM | POA: Diagnosis not present

## 2020-10-06 DIAGNOSIS — E538 Deficiency of other specified B group vitamins: Secondary | ICD-10-CM

## 2020-10-06 DIAGNOSIS — R7309 Other abnormal glucose: Secondary | ICD-10-CM

## 2020-10-06 DIAGNOSIS — Z79899 Other long term (current) drug therapy: Secondary | ICD-10-CM

## 2020-10-06 DIAGNOSIS — E669 Obesity, unspecified: Secondary | ICD-10-CM

## 2020-10-06 LAB — COMPLETE METABOLIC PANEL WITH GFR
AG Ratio: 1.9 (calc) (ref 1.0–2.5)
ALT: 21 U/L (ref 6–29)
AST: 22 U/L (ref 10–35)
Albumin: 4.4 g/dL (ref 3.6–5.1)
Alkaline phosphatase (APISO): 99 U/L (ref 37–153)
BUN: 16 mg/dL (ref 7–25)
CO2: 31 mmol/L (ref 20–32)
Calcium: 10 mg/dL (ref 8.6–10.4)
Chloride: 104 mmol/L (ref 98–110)
Creat: 0.71 mg/dL (ref 0.50–1.05)
GFR, Est African American: 109 mL/min/{1.73_m2} (ref >=60)
GFR, Est Non African American: 94 mL/min/{1.73_m2} (ref >=60)
Globulin: 2.3 g/dL (calc) (ref 1.9–3.7)
Glucose, Bld: 79 mg/dL (ref 65–99)
Potassium: 3.9 mmol/L (ref 3.5–5.3)
Sodium: 142 mmol/L (ref 135–146)
Total Bilirubin: 0.4 mg/dL (ref 0.2–1.2)
Total Protein: 6.7 g/dL (ref 6.1–8.1)

## 2020-10-06 LAB — CBC WITH DIFFERENTIAL/PLATELET
Absolute Monocytes: 1131 cells/uL — ABNORMAL HIGH (ref 200–950)
Basophils Absolute: 83 cells/uL (ref 0–200)
Basophils Relative: 0.7 %
Eosinophils Absolute: 536 cells/uL — ABNORMAL HIGH (ref 15–500)
Eosinophils Relative: 4.5 %
HCT: 46.6 % — ABNORMAL HIGH (ref 35.0–45.0)
Hemoglobin: 15.8 g/dL — ABNORMAL HIGH (ref 11.7–15.5)
Lymphs Abs: 3832 cells/uL (ref 850–3900)
MCH: 31.1 pg (ref 27.0–33.0)
MCHC: 33.9 g/dL (ref 32.0–36.0)
MCV: 91.7 fL (ref 80.0–100.0)
MPV: 12.9 fL — ABNORMAL HIGH (ref 7.5–12.5)
Monocytes Relative: 9.5 %
Neutro Abs: 6319 cells/uL (ref 1500–7800)
Neutrophils Relative %: 53.1 %
Platelets: 206 10*3/uL (ref 140–400)
RBC: 5.08 10*6/uL (ref 3.80–5.10)
RDW: 12.5 % (ref 11.0–15.0)
Total Lymphocyte: 32.2 %
WBC: 11.9 10*3/uL — ABNORMAL HIGH (ref 3.8–10.8)

## 2020-10-06 LAB — TSH: TSH: 2.04 mIU/L (ref 0.40–4.50)

## 2020-10-06 LAB — MAGNESIUM: Magnesium: 2.3 mg/dL (ref 1.5–2.5)

## 2020-10-06 NOTE — Patient Instructions (Addendum)
Goals    . Blood Pressure < 130/80    . DIET - EAT MORE FRUITS AND VEGETABLES     7+ servings daily     . DIET - INCREASE WATER INTAKE     65+ fluid ounces daily          HYPERTENSION INFORMATION  Monitor your blood pressure at home, please keep a record and bring that in with you to your next office visit.   Go to the ER if any CP, SOB, nausea, dizziness, severe HA, changes vision/speech  Testing/Procedures: HOW TO TAKE YOUR BLOOD PRESSURE:  Rest 5 minutes before taking your blood pressure.  Don't smoke or drink caffeinated beverages for at least 30 minutes before.  Take your blood pressure before (not after) you eat.  Sit comfortably with your back supported and both feet on the floor (don't cross your legs).  Elevate your arm to heart level on a table or a desk.  Use the proper sized cuff. It should fit smoothly and snugly around your bare upper arm. There should be enough room to slip a fingertip under the cuff. The bottom edge of the cuff should be 1 inch above the crease of the elbow.  Due to a recent study, SPRINT, we have changed our goal for the systolic or top blood pressure number. Ideally we want your top number at 120.  In the [facility] Trial, 5000 people were randomized to a goal BP of 120 and 5000 people were randomized to a goal BP of less than 140. The patients with the goal BP at 120 had LESS DEMENTIA, LESS HEART ATTACKS, AND LESS STROKES, AS WELL AS OVERALL DECREASED MORTALITY OR DEATH RATE.   There was another study that showed taking your blood pressure medications at night decrease cardiovascular events.  However if you are on a fluid pill, please take this in the morning.   If you are willing, our goal BP is the top number of 120.  Your most recent BP:     Take your medications faithfully as instructed. Maintain a healthy weight. Get at least 150 minutes of aerobic exercise per week. Minimize salt intake. Minimize alcohol intake  DASH Eating  Plan DASH stands for "Dietary Approaches to Stop Hypertension." The DASH eating plan is a healthy eating plan that has been shown to reduce high blood pressure (hypertension). Additional health benefits may include reducing the risk of type 2 diabetes mellitus, heart disease, and stroke. The DASH eating plan may also help with weight loss. WHAT DO I NEED TO KNOW ABOUT THE DASH EATING PLAN? For the DASH eating plan, you will follow these general guidelines:  Choose foods with a percent daily value for sodium of less than 5% (as listed on the food label).  Use salt-free seasonings or herbs instead of table salt or sea salt.  Check with your health care provider or pharmacist before using salt substitutes.  Eat lower-sodium products, often labeled as "lower sodium" or "no salt added."  Eat fresh foods.  Eat more vegetables, fruits, and low-fat dairy products.  Choose whole grains. Look for the word "whole" as the first word in the ingredient list.  Choose fish and skinless chicken or [place] more often than red meat. Limit fish, poultry, and meat to 6 oz (170 g) each day.  Limit sweets, desserts, sugars, and sugary drinks.  Choose heart-healthy fats.  Limit cheese to 1 oz (28 g) per day.  Eat more home-cooked food and less restaurant, buffet,  and fast food.  Limit fried foods.  Cook foods using methods other than frying.  Limit canned vegetables. If you do use them, rinse them well to decrease the sodium.  When eating at a restaurant, ask that your food be prepared with less salt, or no salt if possible. WHAT FOODS CAN I EAT? Seek help from a dietitian for individual calorie needs. Grains Whole grain or whole wheat bread. Brown rice. Whole grain or whole wheat pasta. Quinoa, bulgur, and whole grain cereals. Low-sodium cereals. Corn or whole wheat flour tortillas. Whole grain cornbread. Whole grain crackers. Low-sodium crackers. Vegetables Fresh or frozen vegetables (raw, steamed,  roasted, or grilled). Low-sodium or reduced-sodium tomato and vegetable juices. Low-sodium or reduced-sodium tomato sauce and paste. Low-sodium or reduced-sodium canned vegetables.  Fruits All fresh, canned (in natural juice), or frozen fruits. Meat and Other Protein Products Ground beef (85% or leaner), grass-fed beef, or beef trimmed of fat. Skinless chicken or [place]. Ground chicken or [place]. Pork trimmed of fat. All fish and seafood. Eggs. Dried beans, peas, or lentils. Unsalted nuts and seeds. Unsalted canned beans. Dairy Low-fat dairy products, such as skim or 1% milk, 2% or reduced-fat cheeses, low-fat ricotta or cottage cheese, or plain low-fat yogurt. Low-sodium or reduced-sodium cheeses. Fats and Oils Tub margarines without trans fats. Light or reduced-fat mayonnaise and salad dressings (reduced sodium). Avocado. Safflower, olive, or canola oils. Natural peanut or almond butter. Other Unsalted popcorn and pretzels. The items listed above may not be a complete list of recommended foods or beverages. Contact your dietitian for more options. WHAT FOODS ARE NOT RECOMMENDED? Grains White bread. White pasta. White rice. Refined cornbread. Bagels and croissants. Crackers that contain trans fat. Vegetables Creamed or fried vegetables. Vegetables in a cheese sauce. Regular canned vegetables. Regular canned tomato sauce and paste. Regular tomato and vegetable juices. Fruits Dried fruits. Canned fruit in light or heavy syrup. Fruit juice. Meat and Other Protein Products Fatty cuts of meat. Ribs, chicken wings, bacon, sausage, bologna, salami, chitterlings, fatback, hot dogs, bratwurst, and packaged luncheon meats. Salted nuts and seeds. Canned beans with salt. Dairy Whole or 2% milk, cream, half-and-half, and cream cheese. Whole-fat or sweetened yogurt. Full-fat cheeses or blue cheese. Nondairy creamers and whipped toppings. Processed cheese, cheese spreads, or cheese curds. Condiments Onion  and garlic salt, seasoned salt, table salt, and sea salt. Canned and packaged gravies. Worcestershire sauce. Tartar sauce. Barbecue sauce. Teriyaki sauce. Soy sauce, including reduced sodium. Steak sauce. Fish sauce. Oyster sauce. Cocktail sauce. Horseradish. Ketchup and mustard. Meat flavorings and tenderizers. Bouillon cubes. Hot sauce. Tabasco sauce. Marinades. Taco seasonings. Relishes. Fats and Oils Butter, stick margarine, lard, shortening, ghee, and bacon fat. Coconut, palm kernel, or palm oils. Regular salad dressings. Other Pickles and olives. Salted popcorn and pretzels. The items listed above may not be a complete list of foods and beverages to avoid. Contact your dietitian for more information. WHERE CAN I FIND MORE INFORMATION? National Heart, Lung, and Blood Institute: travelstabloid.com Document Released: 05/18/2011 Document Revised: 10/13/2013 Document Reviewed: 04/02/2013 [facility] Patient Information 2015 [place], [place]. This information is not intended to replace advice given to you by your health care provider. Make sure you discuss any questions you have with your health care provider.

## 2020-10-06 NOTE — Progress Notes (Signed)
6 MONTH FOLLOW UP  Assessment and Plan:  Essential hypertension Atypically elevated; admits has been missing doses; restart, recheck in 4-6 weeks Monitor blood pressure at home; call if consistently over 130/80 Continue DASH diet.   Reminder to go to the ER if any CP, SOB, nausea, dizziness, severe HA, changes vision/speech, left arm numbness and tingling and jaw pain. -     CBC with Differential/Platelet -     COMPLETE METABOLIC PANEL WITH GFR -     Magnesium -     TSH  Mixed hyperlipidemia -     Lipid panel - she declined test today, plans to start diet then recheck at CPE -     TSH  Abnormal glucose Recent A1Cs at goal Discussed diet/exercise, weight management  Defer A1C; check CMP -     COMPLETE METABOLIC PANEL WITH GFR  Vitamin D deficiency Continue supplement  B12 deficiency Continue supplement  Major depressive disorder with single episode, in partial remission ([place]) Continue medications;  Lifestyle discussed: diet/exerise, sleep hygiene, stress management, hydration  Obesity (BMI 30.0-34.9) Long discussion about weight loss, diet, and exercise Recommended diet heavy in fruits and veggies and low in animal meats, cheeses, and dairy products, appropriate calorie intake Patient will work on increasing exercise, less junk food Discussed appropriate weight for height  Follow up at next visit  Discussed med's effects and SE's.  Over 30 minutes of exam, counseling, chart review, and complex, high level critical decision making was performed this visit.   Future Appointments  Date Time Provider [place]  11/03/2020  1:50 PM [facility]  03/31/2021  3:00 PM Liane Comber, NP [facility] None     HPI  60 y.o. female  presents for a follow up for has Essential hypertension; Mixed hyperlipidemia; Vitamin D deficiency; Abnormal glucose; Medication management; Major depressive disorder with single episode, in partial remission ([place]); B12  deficiency; and Obesity (BMI 30.0-34.9) on their problem list.   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 Bought RV and would like to travel.   She has been having hoarseness, post-nasal drip; she has been taking singulaire, zyrtec, saline sprays, flonase.   BMI is Body mass index is 31.71 kg/m., she has not been working on diet and exercise. On her feet all day at work, has new golden doodle, will play with her plans.  Admits to poor diet in the last year. Could work on increasing veggies/fruits. No alcohol. 1 cup coffee, some iced tea, 1 diet soda (working to stop).  Wt Readings from Last 3 Encounters:  10/06/20 179 lb (81.2 kg)  03/31/20 176 lb 6.4 oz (80 kg)  01/22/19 183 lb 3.2 oz (83.1 kg)   Her blood pressure has been controlled at home (120-140s/80s), today their BP is BP: (!) 160/100 , admits has been missing olmesartan She does not workout. She denies chest pain, shortness of breath, dizziness.   She is not on cholesterol medication and denies myalgias. Her cholesterol is not at goal. Patient request to defer check today, will work on lifestyle. The cholesterol last visit was:   Lab Results  Component Value Date   CHOL 209 (H) 03/31/2020   HDL 55 03/31/2020   LDLCALC 132 (H) 03/31/2020   TRIG 114 03/31/2020   CHOLHDL 3.8 03/31/2020   She has been working on diet and exercise for glucose management. Last A1C in the office was:  Lab Results  Component Value  Date   HGBA1C 5.3 03/31/2020   Last GFR: Lab Results  Component Value Date   GFRNONAA 81 03/31/2020   Patient is on Vitamin D supplement.   Lab Results  Component Value Date   VD25OH 79 03/31/2020     She has been taking B12 supplement  Lab Results  Component Value Date   EQASTMHD62 229 03/31/2020     Current Medications:  Current Outpatient Medications on File Prior to Visit  Medication Sig Dispense Refill  . ALPRAZolam (XANAX) 0.5 MG tablet Take 1/2 to 1 tablet at  Bedtime if needed for Sleep 30 tablet 0  . bisoprolol-hydrochlorothiazide (ZIAC) 5-6.25 MG tablet TAKE 1 TABLE DAILY FOR  BLOOD PRESSURE 90 tablet 3  . buPROPion (WELLBUTRIN XL) 300 MG 24 hr tablet TAKE 1 TABLET BY MOUTH  DAILY FOR MOOD 90 tablet 3  . Cetirizine HCl (ZYRTEC ALLERGY PO) Take by mouth daily.    . Cholecalciferol (VITAMIN D PO) Take 2,000 Units by mouth 2 (two) times daily.    Marland Kitchen dicyclomine (BENTYL) 20 MG tablet Take 1 tablet 3 x / day before meals for IBS 270 tablet 1  . Estradiol 10 MCG TABS vaginal tablet INSERT 1 TABLET VAGINALLY 2 TIMES PER WEEK 24 tablet 3  . montelukast (SINGULAIR) 10 MG tablet TAKE 1 TABLET BY MOUTH  DAILY FOR ALLERGIES 90 tablet 3  . Multiple Vitamins-Minerals (WOMENS MULTI PO) Take by mouth daily.    Marland Kitchen olmesartan (BENICAR) 20 MG tablet Take 1 tablet Daily for BP 90 tablet 3  . OVER THE COUNTER MEDICATION Aleve 2 tabs daily    . vitamin C (ASCORBIC ACID) 500 MG tablet Take 500 mg by mouth daily.    . TURMERIC PO Take 1 tablet by mouth daily. (Patient not taking: Reported on 10/06/2020)     No current facility-administered medications on file prior to visit.   Allergies:  No Known Allergies Medical History:  She has Essential hypertension; Mixed hyperlipidemia; Vitamin D deficiency; Abnormal glucose; Medication management; Major depressive disorder with single episode, in partial remission ([place]); B12 deficiency; and Obesity (BMI 30.0-34.9) on their problem list.  Surgical History:  She has a past surgical history that includes Ovarian cyst removal (2001); Reduction mammaplasty (Bilateral, 2009); Foot surgery (Right, 2014); and Colonoscopy (2015). Family History:  Herfamily history includes Dementia (age of onset: 35) in her mother; Diabetes type II in her maternal grandmother and paternal grandfather; Hypertension in her father and mother; Lung cancer in her paternal grandmother; Vascular Disease in her father. Social History:  She reports that she has  never smoked. She has never used smokeless tobacco. She reports that she does not drink alcohol and does not use drugs.  Review of Systems: Review of Systems  Constitutional: Negative for malaise/fatigue and weight loss.  HENT: Negative for hearing loss and tinnitus.   Eyes: Negative for blurred vision and double vision.  Respiratory: Negative for cough, sputum production, shortness of breath and wheezing.   Cardiovascular: Negative for chest pain, palpitations, orthopnea, claudication, leg swelling and PND.  Gastrointestinal: Negative for abdominal pain, blood in stool, constipation, diarrhea, heartburn, melena, nausea and vomiting.  Genitourinary: Negative.   Musculoskeletal: Negative for joint pain and myalgias.  Skin: Negative for rash.  Neurological: Negative for dizziness, tingling, sensory change, weakness and headaches.  Endo/Heme/Allergies: Negative for polydipsia.  Psychiatric/Behavioral: Negative.  Negative for depression, memory loss, substance abuse and suicidal ideas. The patient is not nervous/anxious and does not have insomnia.   All other systems  reviewed and are negative.   Physical Exam: Estimated body mass index is 31.71 kg/m as calculated from the following:   Height as of 03/31/20: 5\' 3"  (1.6 m).   Weight as of this encounter: 179 lb (81.2 kg). BP (!) 160/100   Pulse 66   Temp (!) 97.3 F (36.3 C)   Wt 179 lb (81.2 kg)   SpO2 98%   BMI 31.71 kg/m  General Appearance: Well nourished, in no apparent distress.  Eyes: PERRLA, EOMs, conjunctiva no swelling or erythema Sinuses: No Frontal/maxillary tenderness  ENT/Mouth: Ext aud canals clear, normal light reflex with TMs without erythema, bulging. Good dentition. No erythema, swelling, or exudate on post pharynx. Tonsils not swollen or erythematous. Hearing normal.  Neck: Supple, thyroid normal. No bruits  Respiratory: Respiratory effort normal, BS equal bilaterally without rales, rhonchi, wheezing or stridor.   Cardio: RRR without murmurs, rubs or gallops. Brisk peripheral pulses without edema.  Chest: symmetric, with normal excursions and percussion.  Abdomen: Soft, nontender, no guarding, rebound, hernias, masses, or organomegaly.  Lymphatics: Non tender without lymphadenopathy.  Musculoskeletal: Full ROM all peripheral extremities,5/5 strength, and normal gait.  Skin: Warm, dry without rashes, lesions, ecchymosis. Neuro: Cranial nerves intact, reflexes equal bilaterally. Normal muscle tone, no cerebellar symptoms. Sensation intact.  Psych: Awake and oriented X 3, normal affect, Insight and Judgment appropriate.    Gorden Harms Jameer Storie 4:28 PM [facility]

## 2020-10-08 ENCOUNTER — Other Ambulatory Visit: Payer: Self-pay | Admitting: Adult Health

## 2020-10-08 DIAGNOSIS — I10 Essential (primary) hypertension: Secondary | ICD-10-CM

## 2020-10-08 MED ORDER — PREDNISONE 20 MG PO TABS: ORAL_TABLET | ORAL | 0 refills | Status: DC

## 2020-10-08 MED ORDER — BISOPROLOL-HYDROCHLOROTHIAZIDE 10-6.25 MG PO TABS: 1.0000 | ORAL_TABLET | Freq: Every day | ORAL | 3 refills | Status: AC

## 2020-10-08 MED ORDER — OLMESARTAN MEDOXOMIL 40 MG PO TABS: ORAL_TABLET | ORAL | 3 refills | Status: AC

## 2020-10-23 ENCOUNTER — Other Ambulatory Visit: Payer: Self-pay

## 2020-10-23 ENCOUNTER — Encounter (HOSPITAL_BASED_OUTPATIENT_CLINIC_OR_DEPARTMENT_OTHER): Payer: Self-pay

## 2020-10-23 ENCOUNTER — Emergency Department (HOSPITAL_BASED_OUTPATIENT_CLINIC_OR_DEPARTMENT_OTHER)
Admission: EM | Admit: 2020-10-23 | Discharge: 2020-10-23 | Disposition: A | Discharge status: Home / Self Care | Payer: No Typology Code available for payment source | Attending: Emergency Medicine | Admitting: Emergency Medicine

## 2020-10-23 ENCOUNTER — Emergency Department (HOSPITAL_BASED_OUTPATIENT_CLINIC_OR_DEPARTMENT_OTHER): Payer: No Typology Code available for payment source

## 2020-10-23 DIAGNOSIS — Z79899 Other long term (current) drug therapy: Secondary | ICD-10-CM | POA: Insufficient documentation

## 2020-10-23 DIAGNOSIS — I10 Essential (primary) hypertension: Secondary | ICD-10-CM | POA: Insufficient documentation

## 2020-10-23 DIAGNOSIS — K8020 Calculus of gallbladder without cholecystitis without obstruction: Secondary | ICD-10-CM | POA: Diagnosis not present

## 2020-10-23 DIAGNOSIS — R1011 Right upper quadrant pain: Secondary | ICD-10-CM | POA: Diagnosis present

## 2020-10-23 DIAGNOSIS — K625 Hemorrhage of anus and rectum: Secondary | ICD-10-CM | POA: Insufficient documentation

## 2020-10-23 LAB — URINALYSIS, ROUTINE W REFLEX MICROSCOPIC
Bilirubin Urine: NEGATIVE
Glucose, UA: NEGATIVE mg/dL
Hgb urine dipstick: NEGATIVE
Ketones, ur: NEGATIVE mg/dL
Leukocytes,Ua: NEGATIVE
Nitrite: NEGATIVE
Protein, ur: NEGATIVE mg/dL
Specific Gravity, Urine: 1.006 (ref 1.005–1.030)
pH: 6.5 (ref 5.0–8.0)

## 2020-10-23 LAB — CBC WITH DIFFERENTIAL/PLATELET
Abs Immature Granulocytes: 0.03 10*3/uL (ref 0.00–0.07)
Basophils Absolute: 0.1 10*3/uL (ref 0.0–0.1)
Basophils Relative: 1 %
Eosinophils Absolute: 0.9 10*3/uL — ABNORMAL HIGH (ref 0.0–0.5)
Eosinophils Relative: 7 %
HCT: 43.6 % (ref 36.0–46.0)
Hemoglobin: 14.8 g/dL (ref 12.0–15.0)
Immature Granulocytes: 0 %
Lymphocytes Relative: 32 %
Lymphs Abs: 4 10*3/uL (ref 0.7–4.0)
MCH: 31.8 pg (ref 26.0–34.0)
MCHC: 33.9 g/dL (ref 30.0–36.0)
MCV: 93.8 fL (ref 80.0–100.0)
Monocytes Absolute: 1.8 10*3/uL — ABNORMAL HIGH (ref 0.1–1.0)
Monocytes Relative: 14 %
Neutro Abs: 5.6 10*3/uL (ref 1.7–7.7)
Neutrophils Relative %: 46 %
Platelets: 178 10*3/uL (ref 150–400)
RBC: 4.65 MIL/uL (ref 3.87–5.11)
RDW: 13.4 % (ref 11.5–15.5)
WBC: 12.3 10*3/uL — ABNORMAL HIGH (ref 4.0–10.5)
nRBC: 0 % (ref 0.0–0.2)

## 2020-10-23 LAB — COMPREHENSIVE METABOLIC PANEL
ALT: 21 U/L (ref 0–44)
AST: 24 U/L (ref 15–41)
Albumin: 4 g/dL (ref 3.5–5.0)
Alkaline Phosphatase: 86 U/L (ref 38–126)
Anion gap: 10 (ref 5–15)
BUN: 29 mg/dL — ABNORMAL HIGH (ref 6–20)
CO2: 24 mmol/L (ref 22–32)
Calcium: 9.4 mg/dL (ref 8.9–10.3)
Chloride: 109 mmol/L (ref 98–111)
Creatinine, Ser: 0.81 mg/dL (ref 0.44–1.00)
GFR, Estimated: >60 mL/min (ref >60)
Glucose, Bld: 96 mg/dL (ref 70–99)
Potassium: 3.7 mmol/L (ref 3.5–5.1)
Sodium: 143 mmol/L (ref 135–145)
Total Bilirubin: 0.3 mg/dL (ref 0.3–1.2)
Total Protein: 6.3 g/dL — ABNORMAL LOW (ref 6.5–8.1)

## 2020-10-23 LAB — LIPASE, BLOOD: Lipase: 63 U/L — ABNORMAL HIGH (ref 11–51)

## 2020-10-23 LAB — OCCULT BLOOD X 1 CARD TO LAB, STOOL: Fecal Occult Bld: POSITIVE — AB

## 2020-10-23 NOTE — ED Provider Notes (Signed)
[place] EMERGENCY DEPT Provider Note   CSN: [id number] Arrival date & time: 10/23/20  0207     History Chief Complaint  Patient presents with  . Flank Pain  . Rectal Bleeding    Donna Chapman is a 60 y.o. female.  The history is provided by the patient.  Flank Pain This is a new problem. The current episode started more than 1 week ago. The problem occurs every several days. The problem has been gradually worsening. Associated symptoms include abdominal pain. Pertinent negatives include no chest pain. Exacerbated by: movement. Nothing relieves the symptoms. She has tried nothing for the symptoms.  Rectal Bleeding Quality:  Bright red Amount:  Scant Chronicity:  New Relieved by:  Nothing Worsened by:  Nothing Associated symptoms: abdominal pain   Associated symptoms: no hematemesis and no vomiting   Risk factors: NSAID use   Risk factors: no anticoagulant use and no hx of IBD   Patient reports for the past 6-8 weeks she has had episodes of right flank and right upper quadrant pain.  Tonight she reports she woke up to have a bowel movement and she felt like she did not fully evacuate her bowels.  She then noted some blood in her stool.  No melena.  No vomiting.  No history of ulcers.  No anticoagulant use.  She does use NSAIDs frequently      Past Medical History:  Diagnosis Date  . Allergy   . Hypertension   . IBS (irritable bowel syndrome)     Patient Active Problem List   Diagnosis Date Noted  . Major depressive disorder with single episode, in partial remission ([place]) 03/31/2020  . B12 deficiency 03/31/2020  . Obesity (BMI 30.0-34.9) 03/31/2020  . Mixed hyperlipidemia 12/24/2014  . Vitamin D deficiency 12/24/2014  . Abnormal glucose 12/24/2014  . Medication management 12/24/2014  . Essential hypertension 04/29/2013    Past Surgical History:  Procedure Laterality Date  . COLONOSCOPY  2015   [facility]  . FOOT SURGERY Right 2014   bone spur   . OVARIAN CYST REMOVAL  2001  . REDUCTION MAMMAPLASTY Bilateral 2009     OB History   No obstetric history on file.     Family History  Problem Relation Age of Onset  . Hypertension Mother   . Dementia Mother 60  . Hypertension Father   . Vascular Disease Father   . Diabetes type II Maternal Grandmother   . Lung cancer Paternal Grandmother        millworker  . Diabetes type II Paternal Grandfather     Social History   Tobacco Use  . Smoking status: Never Smoker  . Smokeless tobacco: Never Used  Vaping Use  . Vaping Use: Never used  Substance Use Topics  . Alcohol use: No  . Drug use: No    Home Medications Prior to Admission medications   Medication Sig Start Date End Date Taking? Authorizing Provider  ALPRAZolam Duanne Moron) 0.5 MG tablet Take 1/2 to 1 tablet at Bedtime if needed for Sleep 04/17/19   Unk Pinto, MD  bisoprolol-hydrochlorothiazide [facility]) 10-6.25 MG tablet Take 1 tablet by mouth daily. 10/08/20   Liane Comber, NP  buPROPion (WELLBUTRIN XL) 300 MG 24 hr tablet TAKE 1 TABLET BY MOUTH  DAILY FOR MOOD 05/20/20   Garnet Sierras, NP  Cetirizine HCl (ZYRTEC ALLERGY PO) Take by mouth daily.    [provider]  Cholecalciferol (VITAMIN D PO) Take 2,000 Units by mouth 2 (two) times  daily.    [provider]  dicyclomine (BENTYL) 20 MG tablet Take 1 tablet 3 x / day before meals for IBS 04/07/17 12/06/17  Unk Pinto, MD  Estradiol 10 MCG TABS vaginal tablet INSERT 1 TABLET VAGINALLY 2 TIMES PER WEEK 08/11/19   Unk Pinto, MD  montelukast (SINGULAIR) 10 MG tablet TAKE 1 TABLET BY MOUTH  DAILY FOR ALLERGIES 08/16/20   Liane Comber, NP  Multiple Vitamins-Minerals (WOMENS MULTI PO) Take by mouth daily.    [provider]  olmesartan (BENICAR) 40 MG tablet Take 1 tablet Daily for BP 10/08/20   Liane Comber, NP  OVER THE COUNTER MEDICATION Aleve 2 tabs daily    [provider]  predniSONE (DELTASONE) 20 MG tablet 2  tablets daily for 3 days, 1 tablet daily for 4 days. 10/08/20   Liane Comber, NP  vitamin C (ASCORBIC ACID) 500 MG tablet Take 500 mg by mouth daily.    [provider]    Allergies    Patient has no known allergies.  Review of Systems   Review of Systems  Cardiovascular: Negative for chest pain.  Gastrointestinal: Positive for abdominal pain and hematochezia. Negative for hematemesis and vomiting.  Genitourinary: Positive for flank pain. Negative for dysuria.  All other systems reviewed and are negative.   Physical Exam Updated Vital Signs BP (!) 170/75 (BP Location: Right Arm)   Pulse 71   Temp 98.2 F (36.8 C) (Oral)   Resp 16   Ht 1.626 m (5\' 4" )   Wt 79.5 kg   SpO2 100%   BMI 30.08 kg/m   Physical Exam CONSTITUTIONAL: Well developed/well nourished HEAD: Normocephalic/atraumatic EYES: EOMI/PERRL ENMT: Mucous membranes moist NECK: supple no meningeal signs SPINE/BACK:entire spine nontender CV: S1/S2 noted, no murmurs/rubs/gallops noted LUNGS: Lungs are clear to auscultation bilaterally, no apparent distress ABDOMEN: soft, moderate RLQ tenderness, no rebound or guarding, bowel sounds noted throughout abdomen GU:no cva tenderness Rectal-?Internal hemorrhoid.  No rectal abscess or mass.  No stool impaction.  No blood or melena is noted.  Female chaperone present NEURO: Pt is awake/alert/appropriate, moves all extremitiesx4.  No facial droop.   EXTREMITIES: pulses normal/equal, full ROM SKIN: warm, color normal PSYCH: no abnormalities of mood noted, alert and oriented to situation  ED Results / Procedures / Treatments   Labs (all labs ordered are listed, but only abnormal results are displayed) Labs Reviewed  CBC WITH DIFFERENTIAL/PLATELET - Abnormal; Notable for the following components:      Result Value   WBC 12.3 (*)    Monocytes Absolute 1.8 (*)    Eosinophils Absolute 0.9 (*)    All other components within normal limits  COMPREHENSIVE METABOLIC  PANEL - Abnormal; Notable for the following components:   BUN 29 (*)    Total Protein 6.3 (*)    All other components within normal limits  LIPASE, BLOOD - Abnormal; Notable for the following components:   Lipase 63 (*)    All other components within normal limits  URINALYSIS, ROUTINE W REFLEX MICROSCOPIC - Abnormal; Notable for the following components:   Color, Urine COLORLESS (*)    All other components within normal limits  OCCULT BLOOD X 1 CARD TO LAB, STOOL - Abnormal; Notable for the following components:   Fecal Occult Bld POSITIVE (*)    All other components within normal limits    EKG None  Radiology CT ABDOMEN PELVIS WO CONTRAST  Result Date: 10/23/2020 CLINICAL DATA:  Acute nonlocalized abdominal pain involving the right flank  with radiation to the abdomen. Symptoms have worsened for 6 weeks EXAM: CT ABDOMEN AND PELVIS WITHOUT CONTRAST TECHNIQUE: Multidetector CT imaging of the abdomen and pelvis was performed following the standard protocol without IV contrast. COMPARISON:  11/02/2014 FINDINGS: Lower chest:  No contributory findings. Hepatobiliary: Hepatic steatosis.Cholelithiasis. No evidence of acute cholecystitis. Pancreas: Unremarkable. Spleen: Small and lobulated from nonspecific remote insult. Adrenals/Urinary Tract: Negative adrenals. No hydronephrosis or ureteral stone. Punctate left renal calculus. Unremarkable bladder. Stomach/Bowel:  No obstruction. No appendicitis. Vascular/Lymphatic: No acute vascular abnormality. No mass or adenopathy. Reproductive:No pathologic findings. Other: No ascites or pneumoperitoneum. Musculoskeletal: No acute abnormalities. Spondylosis and advanced lower lumbar facet osteoarthritis. IMPRESSION: 1. No acute finding. 2. Hepatic steatosis and cholelithiasis. 3. Punctate left renal calculus. Electronically Signed   By: Monte Fantasia M.D.   On: 10/23/2020 05:17    Procedures Procedures   Medications Ordered in ED Medications - No data to  display  ED Course  I have reviewed the triage vital signs and the nursing notes.  Pertinent labs & imaging results that were available during my care of the patient were reviewed by me and considered in my medical decision making (see chart for details).    MDM Rules/Calculators/A&P                           This patient presents to the ED for concern of abdominal pain, this involves an extensive number of treatment options, and is a complaint that carries with it a high risk of complications and morbidity.  The differential diagnosis includes kidney stone, urinary tract infection, appendicitis, cholecystitis Patient declined pain medicine at this time  Lab Tests:   I Ordered, reviewed, and interpreted labs, which included electrolytes, complete blood count, urinalysis   Imaging Studies ordered:   I ordered imaging studies which included CT abdomen pelvis   I independently visualized and interpreted imaging which showed cholelithiasis    Reevaluation:  After the interventions stated above, I reevaluated the patient and found patient is improved  5:31 AM Patient presented with right flank pain that she has had intermittently for quite some time.  She also became concerned when she had blood mixed in her stool.  Although she had a positive Hemoccult, there was no melena or blood and her hemoglobin is stable.  Low suspicion for acute GI bleed.  Patient was found to have cholelithiasis without signs of cholecystitis on CT imaging.  Patient reports the pain does get worse at times with eating.  Will refer to surgery.  We discussed return precautions  This patient was evaluated during a time of global shortage of iodinated contrast media. Based on guidance from the [employer] of Radiology, best practices, and local institutional approaches an alternative path for evaluating and managing the patient may have been employed in order to provide optimal care during this shortage. The  current situation has been discussed with the patient.   Final Clinical Impression(s) / ED Diagnoses Final diagnoses:  Calculus of gallbladder without cholecystitis without obstruction    Rx / DC Orders ED Discharge Orders    None       Ripley Fraise, MD 10/23/20 [phone]

## 2020-10-23 NOTE — ED Triage Notes (Addendum)
Pt is present to the ED for intermittent right flank pain that radiates to her abdomen, that has progressively worsened over the last six weeks. Pt also c/o that she gets a pressure when she needs to have a bowel movement, then when she has a bowel movement she states, "I feel like I don't completely empty out my bowels and I recently noticed a small amount of bright red blood from my rectum".   Hx of IBS and excessive gas.

## 2020-10-24 ENCOUNTER — Other Ambulatory Visit: Payer: Self-pay | Admitting: Internal Medicine

## 2020-10-24 DIAGNOSIS — K8020 Calculus of gallbladder without cholecystitis without obstruction: Secondary | ICD-10-CM

## 2020-11-03 ENCOUNTER — Other Ambulatory Visit: Payer: Self-pay

## 2020-11-03 ENCOUNTER — Ambulatory Visit
Admission: RE | Admit: 2020-11-03 | Discharge: 2020-11-03 | Disposition: A | Discharge status: Home / Self Care | Payer: PRIVATE HEALTH INSURANCE | Source: Ambulatory Visit | Attending: Internal Medicine | Admitting: Internal Medicine

## 2020-11-03 DIAGNOSIS — Z1231 Encounter for screening mammogram for malignant neoplasm of breast: Secondary | ICD-10-CM

## 2020-11-15 ENCOUNTER — Other Ambulatory Visit: Payer: Self-pay | Admitting: Adult Health

## 2020-11-15 DIAGNOSIS — Z79899 Other long term (current) drug therapy: Secondary | ICD-10-CM

## 2020-11-15 DIAGNOSIS — I10 Essential (primary) hypertension: Secondary | ICD-10-CM

## 2020-11-16 ENCOUNTER — Ambulatory Visit (INDEPENDENT_AMBULATORY_CARE_PROVIDER_SITE_OTHER): Payer: PRIVATE HEALTH INSURANCE

## 2020-11-16 ENCOUNTER — Other Ambulatory Visit: Payer: Self-pay

## 2020-11-16 DIAGNOSIS — I10 Essential (primary) hypertension: Secondary | ICD-10-CM | POA: Diagnosis not present

## 2020-11-16 DIAGNOSIS — Z79899 Other long term (current) drug therapy: Secondary | ICD-10-CM

## 2020-11-16 LAB — BASIC METABOLIC PANEL WITH GFR
BUN/Creatinine Ratio: 36 (calc) — ABNORMAL HIGH (ref 6–22)
BUN: 31 mg/dL — ABNORMAL HIGH (ref 7–25)
CO2: 29 mmol/L (ref 20–32)
Calcium: 10 mg/dL (ref 8.6–10.4)
Chloride: 107 mmol/L (ref 98–110)
Creat: 0.86 mg/dL (ref 0.50–1.05)
GFR, Est African American: 86 mL/min/{1.73_m2} (ref >=60)
GFR, Est Non African American: 74 mL/min/{1.73_m2} (ref >=60)
Glucose, Bld: 69 mg/dL (ref 65–99)
Potassium: 4.2 mmol/L (ref 3.5–5.3)
Sodium: 142 mmol/L (ref 135–146)

## 2020-11-16 NOTE — Progress Notes (Signed)
Patient presents to the office for a nurse visit to have her blood pressure checked and have a BMP/GFR drawn. States that she has been checking her BP at home and her numbers have been really good. This morning when she checked it was 113/70. In the office this morning, BP was 118/74. Increasing her Bisoprolol/HCTZ has made a difference.

## 2020-11-25 ENCOUNTER — Ambulatory Visit: Payer: Self-pay | Admitting: Surgery

## 2020-11-25 NOTE — H&P (Signed)
Surgical Evaluation  Chief Complaint: abdominal pain  HPI: This is a very pleasant 60 year old woman who is referred for evaluation of possible biliary colic.  She has had several episodes of right flank pain radiating around to the abdominal region, queasiness, bloating and occasionally diarrhea.  She was found to have gallstones.  Symptoms are aggravated by eating, specifically greasy foods.  She has controlled her symptoms for the last couple weeks by maintaining a fairly low-fat diet.  Previous abdominal surgery includes laparoscopy for infertility many years ago.  She reports that she is otherwise in good health.  No Known Allergies  Past Medical History:  Diagnosis Date   Allergy    Hypertension    IBS (irritable bowel syndrome)     Past Surgical History:  Procedure Laterality Date   COLONOSCOPY  2015   [facility]   FOOT SURGERY Right 2014   bone spur   OVARIAN CYST REMOVAL  2001   REDUCTION MAMMAPLASTY Bilateral 2009    Family History  Problem Relation Age of Onset   Hypertension Mother    Dementia Mother 44   Hypertension Father    Vascular Disease Father    Diabetes type II Maternal Grandmother    Lung cancer Paternal Grandmother        millworker   Diabetes type II Paternal Grandfather     Social History   Socioeconomic History   Marital status: Married    Spouse name: Not on file   Number of children: Not on file   Years of education: Not on file   Highest education level: Not on file  Occupational History   Not on file  Tobacco Use   Smoking status: Never   Smokeless tobacco: Never  Vaping Use   Vaping Use: Never used  Substance and Sexual Activity   Alcohol use: No   Drug use: No   Sexual activity: Yes    Partners: Male    Birth control/protection: Post-menopausal  Other Topics Concern   Not on file  Social History Narrative   Not on file   Social Determinants of Health   Financial Resource Strain: Not on file  Food Insecurity: Not on  file  Transportation Needs: Not on file  Physical Activity: Not on file  Stress: Not on file  Social Connections: Not on file    Current Outpatient Medications on File Prior to Visit  Medication Sig Dispense Refill   ALPRAZolam (XANAX) 0.5 MG tablet Take 1/2 to 1 tablet at Bedtime if needed for Sleep 30 tablet 0   bisoprolol-hydrochlorothiazide (ZIAC) 10-6.25 MG tablet Take 1 tablet by mouth daily. 90 tablet 3   buPROPion (WELLBUTRIN XL) 300 MG 24 hr tablet TAKE 1 TABLET BY MOUTH  DAILY FOR MOOD 90 tablet 3   Cetirizine HCl (ZYRTEC ALLERGY PO) Take by mouth daily.     Cholecalciferol (VITAMIN D PO) Take 2,000 Units by mouth 2 (two) times daily.     dicyclomine (BENTYL) 20 MG tablet Take 1 tablet 3 x / day before meals for IBS 270 tablet 1   Estradiol 10 MCG TABS vaginal tablet INSERT 1 TABLET VAGINALLY 2 TIMES PER WEEK 24 tablet 3   montelukast (SINGULAIR) 10 MG tablet TAKE 1 TABLET BY MOUTH  DAILY FOR ALLERGIES 90 tablet 3   Multiple Vitamins-Minerals (WOMENS MULTI PO) Take by mouth daily.     olmesartan (BENICAR) 40 MG tablet Take 1 tablet Daily for BP 90 tablet 3   OVER THE COUNTER MEDICATION Aleve 2  tabs daily     vitamin C (ASCORBIC ACID) 500 MG tablet Take 500 mg by mouth daily.     No current facility-administered medications on file prior to visit.    Review of Systems: a complete, 10pt review of systems was completed with pertinent positives and negatives as documented in the HPI  Physical Exam: Weight: 178.25 lb   Height: 64 in  Body Surface Area: 1.86 m   Body Mass Index: 30.6 kg/m   Temp.: 97.8 F    Pulse: 66 (Regular)    P.OX: 98% (Room air) BP: 140/92(Sitting, Left Arm, Standard)  Alert, well-appearing Unlabored respirations Abdomen soft, nontender, nondistended.    CBC Latest Ref Rng & Units 10/23/2020 10/06/2020 03/31/2020  WBC 4.0 - 10.5 K/uL 12.3(H) 11.9(H) 10.6  Hemoglobin 12.0 - 15.0 g/dL 14.8 15.8(H) 15.8(H)  Hematocrit 36.0 - 46.0 % 43.6 46.6(H)  45.7(H)  Platelets 150 - 400 K/uL 178 206 197    CMP Latest Ref Rng & Units 11/16/2020 10/23/2020 10/06/2020  Glucose 65 - 99 mg/dL 69 96 79  BUN 7 - 25 mg/dL 31(H) 29(H) 16  Creatinine 0.50 - 1.05 mg/dL 0.86 0.81 0.71  Sodium 135 - 146 mmol/L 142 143 142  Potassium 3.5 - 5.3 mmol/L 4.2 3.7 3.9  Chloride 98 - 110 mmol/L 107 109 104  CO2 20 - 32 mmol/L 29 24 31   Calcium 8.6 - 10.4 mg/dL 10.0 9.4 10.0  Total Protein 6.5 - 8.1 g/dL - 6.3(L) 6.7  Total Bilirubin 0.3 - 1.2 mg/dL - 0.3 0.4  Alkaline Phos 38 - 126 U/L - 86 -  AST 15 - 41 U/L - 24 22  ALT 0 - 44 U/L - 21 21    No results found for: INR, PROTIME  Imaging: No results found.   A/P: BILIARY COLIC (B14.78) Story: I recommend proceeding with laparoscopic cholecystectomy with possible cholangiogram. Discussed risks of surgery including bleeding, pain, scarring, intraabdominal injury specifically to the common bile duct and sequelae, bile leak, conversion to open surgery, failure to resolve symptoms, blood clots/ pulmonary embolus, heart attack, pneumonia, stroke, death. Questions welcomed and answered to patient's satisfaction. She wishes to proceed with scheduling.    Patient Active Problem List   Diagnosis Date Noted   Major depressive disorder with single episode, in partial remission ([place]) 03/31/2020   B12 deficiency 03/31/2020   Obesity (BMI 30.0-34.9) 03/31/2020   Mixed hyperlipidemia 12/24/2014   Vitamin D deficiency 12/24/2014   Abnormal glucose 12/24/2014   Medication management 12/24/2014   Essential hypertension 04/29/2013       Romana Juniper, MD [facility]  See AMION to contact appropriate on-call provider

## 2020-12-22 ENCOUNTER — Encounter: Payer: 59 | Admitting: Internal Medicine

## 2021-02-02 NOTE — Patient Instructions (Signed)
DUE TO COVID-19 ONLY ONE VISITOR IS ALLOWED TO COME WITH YOU AND STAY IN THE WAITING ROOM ONLY DURING PRE OP AND PROCEDURE.   **NO VISITORS ARE ALLOWED IN THE SHORT STAY AREA OR RECOVERY ROOM!!**  IF YOU WILL BE ADMITTED INTO THE HOSPITAL YOU ARE ALLOWED ONLY TWO SUPPORT PEOPLE DURING VISITATION HOURS ONLY (10AM -8PM)   The support person(s) may change daily. The support person(s) must pass our screening, gel in and out, and wear a mask at all times, including in the patient's room. Patients must also wear a mask when staff or their support person are in the room.  No visitors under the age of 38. Any visitor under the age of 78 must be accompanied by an adult.     Your procedure is scheduled on: 02/18/21   Report to [facility] Main  Entrance   Report to Short Stay at 5:15 AM   [facility])   Call this number if you have problems the morning of surgery [phone]   Do not eat food :After Midnight.   May have liquids until ; 4:30 am   day of surgery  CLEAR LIQUID DIET  Foods Allowed                                                                     Foods Excluded  Water, Black Coffee and tea, regular and decaf                             liquids that you cannot  Plain Jell-O in any flavor  (No red)                                           see through such as: Fruit ices (not with fruit pulp)                                     milk, soups, orange juice              Iced Popsicles (No red)                                    All solid food                                   Apple juices Sports drinks like Gatorade (No red) Lightly seasoned clear broth or consume(fat free) Sugar  Sample Menu Breakfast                                Lunch                                     Supper Cranberry juice  Beef broth                            Chicken broth Jell-O                                     Grape juice                           Apple juice Coffee or  tea                        Jell-O                                      Popsicle                                                Coffee or tea                        Coffee or tea     Oral Hygiene is also important to reduce your risk of infection.                                    Remember - BRUSH YOUR TEETH THE MORNING OF SURGERY WITH YOUR REGULAR TOOTHPASTE   Do NOT smoke after Midnight   Take these medicines the morning of surgery with A SIP OF WATER: CETIRIZINE AS NEEDED.                              You may not have any metal on your body including hair pins, jewelry, and body piercing             Do not wear make-up, lotions, powders, perfumes/cologne, or deodorant  Do not wear nail polish including gel and S&S, artificial/acrylic nails, or any other type of covering on natural nails including finger and toenails. If you have artificial nails, gel coating, etc. that needs to be removed by a nail salon please have this removed prior to surgery or surgery may need to be canceled/ delayed if the surgeon/ anesthesia feels like they are unable to be safely monitored.   Do not shave  48 hours prior to surgery.    Do not bring valuables to the hospital. [place].   Contacts, dentures or bridgework may not be worn into surgery.   Bring small overnight bag day of surgery.    Patients discharged the day of surgery will not be allowed to drive home.   Special Instructions: Bring a copy of your healthcare power of attorney and living will documents         the day of surgery if you haven't scanned them in before.              Please read over the following fact sheets you were  given: IF YOU HAVE QUESTIONS ABOUT YOUR PRE OP INSTRUCTIONS PLEASE CALL [phone]   [place] - Preparing for Surgery Before surgery, you can play an important role.  Because skin is not sterile, your skin needs to be as free of germs as possible.  You can reduce  the number of germs on your skin by washing with CHG (chlorahexidine gluconate) soap before surgery.  CHG is an antiseptic cleaner which kills germs and bonds with the skin to continue killing germs even after washing. Please DO NOT use if you have an allergy to CHG or antibacterial soaps.  If your skin becomes reddened/irritated stop using the CHG and inform your nurse when you arrive at Short Stay. Do not shave (including legs and underarms) for at least 48 hours prior to the first CHG shower.  You may shave your face/neck. Please follow these instructions carefully:  1.  Shower with CHG Soap the night before surgery and the  morning of Surgery.  2.  If you choose to wash your hair, wash your hair first as usual with your  normal  shampoo.  3.  After you shampoo, rinse your hair and body thoroughly to remove the  shampoo.                           4.  Use CHG as you would any other liquid soap.  You can apply chg directly  to the skin and wash                       Gently with a scrungie or clean washcloth.  5.  Apply the CHG Soap to your body ONLY FROM THE NECK DOWN.   Do not use on face/ open                           Wound or open sores. Avoid contact with eyes, ears mouth and genitals (private parts).                       Wash face,  Genitals (private parts) with your normal soap.             6.  Wash thoroughly, paying special attention to the area where your surgery  will be performed.  7.  Thoroughly rinse your body with warm water from the neck down.  8.  DO NOT shower/wash with your normal soap after using and rinsing off  the CHG Soap.                9.  Pat yourself dry with a clean towel.            10.  Wear clean pajamas.            11.  Place clean sheets on your bed the night of your first shower and do not  sleep with pets. Day of Surgery : Do not apply any lotions/deodorants the morning of surgery.  Please wear clean clothes to the hospital/surgery center.  FAILURE TO FOLLOW  THESE INSTRUCTIONS MAY RESULT IN THE CANCELLATION OF YOUR SURGERY PATIENT SIGNATURE_________________________________  NURSE SIGNATURE__________________________________  ________________________________________________________________________

## 2021-02-03 ENCOUNTER — Encounter (HOSPITAL_COMMUNITY)
Admission: RE | Admit: 2021-02-03 | Discharge: 2021-02-03 | Disposition: A | Discharge status: Home / Self Care | Payer: No Typology Code available for payment source | Source: Ambulatory Visit | Attending: Surgery | Admitting: Surgery

## 2021-02-03 ENCOUNTER — Other Ambulatory Visit: Payer: Self-pay

## 2021-02-03 ENCOUNTER — Encounter (HOSPITAL_COMMUNITY): Payer: Self-pay

## 2021-02-03 DIAGNOSIS — Z01812 Encounter for preprocedural laboratory examination: Secondary | ICD-10-CM | POA: Insufficient documentation

## 2021-02-03 HISTORY — DX: Fatty (change of) liver, not elsewhere classified: K76.0

## 2021-02-03 HISTORY — DX: Depression, unspecified: F32.A

## 2021-02-03 HISTORY — DX: Unspecified asthma, uncomplicated: J45.909

## 2021-02-03 LAB — BASIC METABOLIC PANEL
Anion gap: 6 (ref 5–15)
BUN: 19 mg/dL (ref 6–20)
CO2: 27 mmol/L (ref 22–32)
Calcium: 9.4 mg/dL (ref 8.9–10.3)
Chloride: 109 mmol/L (ref 98–111)
Creatinine, Ser: 0.69 mg/dL (ref 0.44–1.00)
GFR, Estimated: >60 mL/min (ref >60)
Glucose, Bld: 81 mg/dL (ref 70–99)
Potassium: 3.9 mmol/L (ref 3.5–5.1)
Sodium: 142 mmol/L (ref 135–145)

## 2021-02-03 LAB — CBC
HCT: 45 % (ref 36.0–46.0)
Hemoglobin: 14.9 g/dL (ref 12.0–15.0)
MCH: 31 pg (ref 26.0–34.0)
MCHC: 33.1 g/dL (ref 30.0–36.0)
MCV: 93.6 fL (ref 80.0–100.0)
Platelets: 164 10*3/uL (ref 150–400)
RBC: 4.81 MIL/uL (ref 3.87–5.11)
RDW: 12.8 % (ref 11.5–15.5)
WBC: 8.1 10*3/uL (ref 4.0–10.5)
nRBC: 0 % (ref 0.0–0.2)

## 2021-02-03 NOTE — Progress Notes (Signed)
COVID Vaccine Completed: Yes Date COVID Vaccine completed: 06/10/20. X 3 COVID vaccine manufacturer: Pfizer     Covid test: N/A PCP - Dr. Unk Pinto Cardiologist - NO  Chest x-ray -  EKG - 03/31/20. : EPIC Stress Test -  ECHO -  Cardiac Cath -  Pacemaker/ICD device last checked:  Sleep Study -  CPAP -   Fasting Blood Sugar -  Checks Blood Sugar _____ times a day  Blood Thinner Instructions: Aspirin Instructions: Last Dose:  Anesthesia review:   Patient denies shortness of breath, fever, cough and chest pain at PAT appointment   Patient verbalized understanding of instructions that were given to them at the PAT appointment. Patient was also instructed that they will need to review over the PAT instructions again at home before surgery.

## 2021-02-17 ENCOUNTER — Other Ambulatory Visit: Payer: Self-pay | Admitting: Adult Health

## 2021-02-17 MED ORDER — DICYCLOMINE HCL 10 MG PO CAPS: ORAL_CAPSULE | ORAL | 3 refills | Status: AC

## 2021-02-18 ENCOUNTER — Ambulatory Visit (HOSPITAL_COMMUNITY): Payer: No Typology Code available for payment source | Admitting: Certified Registered Nurse Anesthetist

## 2021-02-18 ENCOUNTER — Encounter (HOSPITAL_COMMUNITY)
Admission: RE | Disposition: A | Discharge status: Home / Self Care | Payer: Self-pay | Source: Home / Self Care | Attending: Surgery

## 2021-02-18 ENCOUNTER — Ambulatory Visit (HOSPITAL_COMMUNITY)
Admission: RE | Admit: 2021-02-18 | Discharge: 2021-02-18 | Disposition: A | Discharge status: Home / Self Care | Payer: No Typology Code available for payment source | Attending: Surgery | Admitting: Surgery

## 2021-02-18 DIAGNOSIS — K8050 Calculus of bile duct without cholangitis or cholecystitis without obstruction: Secondary | ICD-10-CM | POA: Diagnosis present

## 2021-02-18 DIAGNOSIS — Z79899 Other long term (current) drug therapy: Secondary | ICD-10-CM | POA: Insufficient documentation

## 2021-02-18 DIAGNOSIS — K8020 Calculus of gallbladder without cholecystitis without obstruction: Secondary | ICD-10-CM | POA: Insufficient documentation

## 2021-02-18 DIAGNOSIS — R160 Hepatomegaly, not elsewhere classified: Secondary | ICD-10-CM | POA: Insufficient documentation

## 2021-02-18 SURGERY — CHOLECYSTECTOMY, ROBOT-ASSISTED, LAPAROSCOPIC
Anesthesia: General | Site: Abdomen

## 2021-02-18 MED ORDER — ACETAMINOPHEN 650 MG RE SUPP
650.0000 mg | RECTAL | Status: DC | PRN
  Filled 2021-02-18: qty 1

## 2021-02-18 MED ORDER — CHLORHEXIDINE GLUCONATE 4 % EX LIQD: 60.0000 mL | Freq: Once | CUTANEOUS | Status: DC

## 2021-02-18 MED ORDER — OXYCODONE HCL 5 MG PO TABS: 5.0000 mg | ORAL_TABLET | ORAL | Status: DC | PRN

## 2021-02-18 MED ORDER — ESMOLOL HCL 100 MG/10ML IV SOLN
INTRAVENOUS | Status: AC
  Filled 2021-02-18: qty 10

## 2021-02-18 MED ORDER — ROCURONIUM BROMIDE 10 MG/ML (PF) SYRINGE
PREFILLED_SYRINGE | INTRAVENOUS | Status: AC
  Filled 2021-02-18: qty 10

## 2021-02-18 MED ORDER — ESMOLOL HCL 100 MG/10ML IV SOLN
INTRAVENOUS | Status: DC | PRN
  Administered 2021-02-18: 20 mg via INTRAVENOUS

## 2021-02-18 MED ORDER — ONDANSETRON HCL 4 MG/2ML IJ SOLN
INTRAMUSCULAR | Status: AC
  Filled 2021-02-18: qty 2

## 2021-02-18 MED ORDER — TRAMADOL HCL 50 MG PO TABS: 50.0000 mg | ORAL_TABLET | Freq: Four times a day (QID) | ORAL | 0 refills | Status: AC | PRN

## 2021-02-18 MED ORDER — FENTANYL CITRATE PF 50 MCG/ML IJ SOSY: 25.0000 ug | PREFILLED_SYRINGE | INTRAMUSCULAR | Status: DC | PRN

## 2021-02-18 MED ORDER — DEXAMETHASONE SODIUM PHOSPHATE 10 MG/ML IJ SOLN
INTRAMUSCULAR | Status: AC
  Filled 2021-02-18: qty 1

## 2021-02-18 MED ORDER — LIDOCAINE 2% (20 MG/ML) 5 ML SYRINGE
INTRAMUSCULAR | Status: DC | PRN
  Administered 2021-02-18: 80 mg via INTRAVENOUS

## 2021-02-18 MED ORDER — EPHEDRINE SULFATE-NACL 50-0.9 MG/10ML-% IV SOSY
PREFILLED_SYRINGE | INTRAVENOUS | Status: DC | PRN
  Administered 2021-02-18 (×2): 5 mg via INTRAVENOUS
  Administered 2021-02-18: 10 mg via INTRAVENOUS

## 2021-02-18 MED ORDER — BUPIVACAINE-EPINEPHRINE 0.25% -1:200000 IJ SOLN
INTRAMUSCULAR | Status: DC | PRN
  Administered 2021-02-18: 30 mL

## 2021-02-18 MED ORDER — BUPIVACAINE-EPINEPHRINE (PF) 0.25% -1:200000 IJ SOLN
INTRAMUSCULAR | Status: AC
  Filled 2021-02-18: qty 30

## 2021-02-18 MED ORDER — ACETAMINOPHEN 325 MG PO TABS: 650.0000 mg | ORAL_TABLET | ORAL | Status: DC | PRN

## 2021-02-18 MED ORDER — SCOPOLAMINE 1 MG/3DAYS TD PT72: 1.0000 | MEDICATED_PATCH | Freq: Once | TRANSDERMAL | Status: DC

## 2021-02-18 MED ORDER — FENTANYL CITRATE (PF) 100 MCG/2ML IJ SOLN
INTRAMUSCULAR | Status: AC
  Filled 2021-02-18: qty 2

## 2021-02-18 MED ORDER — PHENYLEPHRINE 40 MCG/ML (10ML) SYRINGE FOR IV PUSH (FOR BLOOD PRESSURE SUPPORT)
PREFILLED_SYRINGE | INTRAVENOUS | Status: DC | PRN
  Administered 2021-02-18: 120 ug via INTRAVENOUS

## 2021-02-18 MED ORDER — ROCURONIUM BROMIDE 10 MG/ML (PF) SYRINGE
PREFILLED_SYRINGE | INTRAVENOUS | Status: DC | PRN
  Administered 2021-02-18: 60 mg via INTRAVENOUS

## 2021-02-18 MED ORDER — SUGAMMADEX SODIUM 200 MG/2ML IV SOLN
INTRAVENOUS | Status: DC | PRN
  Administered 2021-02-18: 200 mg via INTRAVENOUS

## 2021-02-18 MED ORDER — DOCUSATE SODIUM 100 MG PO CAPS: 100.0000 mg | ORAL_CAPSULE | Freq: Two times a day (BID) | ORAL | 0 refills | Status: AC

## 2021-02-18 MED ORDER — GABAPENTIN 300 MG PO CAPS
300.0000 mg | ORAL_CAPSULE | ORAL | Status: AC
  Administered 2021-02-18: 300 mg via ORAL
  Filled 2021-02-18: qty 1

## 2021-02-18 MED ORDER — 0.9 % SODIUM CHLORIDE (POUR BTL) OPTIME
TOPICAL | Status: DC | PRN
  Administered 2021-02-18: 1000 mL

## 2021-02-18 MED ORDER — ACETAMINOPHEN 500 MG PO TABS
1000.0000 mg | ORAL_TABLET | ORAL | Status: AC
  Administered 2021-02-18: 1000 mg via ORAL
  Filled 2021-02-18: qty 2

## 2021-02-18 MED ORDER — ORAL CARE MOUTH RINSE: 15.0000 mL | Freq: Once | OROMUCOSAL | Status: AC

## 2021-02-18 MED ORDER — BUPIVACAINE LIPOSOME 1.3 % IJ SUSP
INTRAMUSCULAR | Status: DC | PRN
  Administered 2021-02-18: 20 mL

## 2021-02-18 MED ORDER — PROPOFOL 10 MG/ML IV BOLUS
INTRAVENOUS | Status: DC | PRN
  Administered 2021-02-18: 130 mg via INTRAVENOUS
  Administered 2021-02-18: 50 mg via INTRAVENOUS

## 2021-02-18 MED ORDER — CHLORHEXIDINE GLUCONATE 0.12 % MT SOLN
15.0000 mL | Freq: Once | OROMUCOSAL | Status: AC
  Administered 2021-02-18: 15 mL via OROMUCOSAL

## 2021-02-18 MED ORDER — SODIUM CHLORIDE 0.9% FLUSH: 3.0000 mL | INTRAVENOUS | Status: DC | PRN

## 2021-02-18 MED ORDER — LACTATED RINGERS IV SOLN: INTRAVENOUS | Status: DC

## 2021-02-18 MED ORDER — PROMETHAZINE HCL 25 MG/ML IJ SOLN: 6.2500 mg | INTRAMUSCULAR | Status: DC | PRN

## 2021-02-18 MED ORDER — CEFAZOLIN SODIUM-DEXTROSE 2-4 GM/100ML-% IV SOLN
2.0000 g | INTRAVENOUS | Status: AC
  Administered 2021-02-18: 2 g via INTRAVENOUS
  Filled 2021-02-18: qty 100

## 2021-02-18 MED ORDER — BUPIVACAINE LIPOSOME 1.3 % IJ SUSP
INTRAMUSCULAR | Status: AC
  Filled 2021-02-18: qty 20

## 2021-02-18 MED ORDER — MIDAZOLAM HCL 5 MG/5ML IJ SOLN
INTRAMUSCULAR | Status: DC | PRN
  Administered 2021-02-18: 2 mg via INTRAVENOUS

## 2021-02-18 MED ORDER — ONDANSETRON HCL 4 MG/2ML IJ SOLN
INTRAMUSCULAR | Status: DC | PRN
  Administered 2021-02-18: 4 mg via INTRAVENOUS

## 2021-02-18 MED ORDER — INDOCYANINE GREEN 25 MG IV SOLR
2.5000 mg | Freq: Once | INTRAVENOUS | Status: AC
  Administered 2021-02-18: 2.5 mg via INTRAVENOUS
  Filled 2021-02-18: qty 1

## 2021-02-18 MED ORDER — DEXAMETHASONE SODIUM PHOSPHATE 4 MG/ML IJ SOLN
INTRAMUSCULAR | Status: DC | PRN
  Administered 2021-02-18: 10 mg via INTRAVENOUS

## 2021-02-18 MED ORDER — SODIUM CHLORIDE 0.9 % IV SOLN: 250.0000 mL | INTRAVENOUS | Status: DC | PRN

## 2021-02-18 MED ORDER — BUPIVACAINE LIPOSOME 1.3 % IJ SUSP: 20.0000 mL | Freq: Once | INTRAMUSCULAR | Status: DC

## 2021-02-18 MED ORDER — FENTANYL CITRATE (PF) 100 MCG/2ML IJ SOLN
INTRAMUSCULAR | Status: DC | PRN
  Administered 2021-02-18: 100 ug via INTRAVENOUS
  Administered 2021-02-18: 25 ug via INTRAVENOUS
  Administered 2021-02-18: 100 ug via INTRAVENOUS

## 2021-02-18 MED ORDER — SODIUM CHLORIDE 0.9% FLUSH: 3.0000 mL | Freq: Two times a day (BID) | INTRAVENOUS | Status: DC

## 2021-02-18 MED ORDER — PROPOFOL 10 MG/ML IV BOLUS
INTRAVENOUS | Status: AC
  Filled 2021-02-18: qty 20

## 2021-02-18 MED ORDER — MIDAZOLAM HCL 2 MG/2ML IJ SOLN
INTRAMUSCULAR | Status: AC
  Filled 2021-02-18: qty 2

## 2021-02-18 MED ORDER — LIDOCAINE 2% (20 MG/ML) 5 ML SYRINGE
INTRAMUSCULAR | Status: AC
  Filled 2021-02-18: qty 5

## 2021-02-18 SURGICAL SUPPLY — 48 items
APPLIER CLIP 5 13 M/L LIGAMAX5 (MISCELLANEOUS)
BAG COUNTER SPONGE SURGICOUNT (BAG) ×2 IMPLANT
BAG SURGICOUNT SPONGE COUNTING (BAG) ×1
BLADE SURG SZ11 CARB STEEL (BLADE) ×3 IMPLANT
CHLORAPREP W/TINT 26 (MISCELLANEOUS) ×3 IMPLANT
CLIP APPLIE 5 13 M/L LIGAMAX5 (MISCELLANEOUS) IMPLANT
CLIP LIGATING HEMO O LOK GREEN (MISCELLANEOUS) ×3 IMPLANT
COVER TIP SHEARS 8 DVNC (MISCELLANEOUS) ×1 IMPLANT
COVER TIP SHEARS 8MM DA VINCI (MISCELLANEOUS) ×3
DECANTER SPIKE VIAL GLASS SM (MISCELLANEOUS) ×3 IMPLANT
DERMABOND ADVANCED (GAUZE/BANDAGES/DRESSINGS) ×2
DERMABOND ADVANCED .7 DNX12 (GAUZE/BANDAGES/DRESSINGS) ×1 IMPLANT
DRAPE ARM DVNC X/XI (DISPOSABLE) ×4 IMPLANT
DRAPE COLUMN DVNC XI (DISPOSABLE) ×1 IMPLANT
DRAPE DA VINCI XI ARM (DISPOSABLE) ×12
DRAPE DA VINCI XI COLUMN (DISPOSABLE) ×3
ELECT REM PT RETURN 15FT ADLT (MISCELLANEOUS) ×3 IMPLANT
GLOVE SURG ENC MOIS LTX SZ6 (GLOVE) ×6 IMPLANT
GLOVE SURG MICRO LTX SZ6 (GLOVE) ×3 IMPLANT
GLOVE SURG UNDER LTX SZ6.5 (GLOVE) ×6 IMPLANT
GOWN STRL REUS W/TWL LRG LVL3 (GOWN DISPOSABLE) ×6 IMPLANT
GOWN STRL REUS W/TWL XL LVL3 (GOWN DISPOSABLE) IMPLANT
GRASPER SUT TROCAR 14GX15 (MISCELLANEOUS) ×3 IMPLANT
KIT BASIN OR (CUSTOM PROCEDURE TRAY) ×3 IMPLANT
KIT PROCEDURE DA VINCI SI (MISCELLANEOUS)
KIT PROCEDURE DVNC SI (MISCELLANEOUS) IMPLANT
KIT TURNOVER KIT A (KITS) ×3 IMPLANT
MANIFOLD NEPTUNE II (INSTRUMENTS) ×3 IMPLANT
NEEDLE HYPO 22GX1.5 SAFETY (NEEDLE) ×3 IMPLANT
NEEDLE INSUFFLATION 14GA 120MM (NEEDLE) ×3 IMPLANT
PACK CARDIOVASCULAR III (CUSTOM PROCEDURE TRAY) ×3 IMPLANT
SEAL CANN UNIV 5-8 DVNC XI (MISCELLANEOUS) ×4 IMPLANT
SEAL XI 5MM-8MM UNIVERSAL (MISCELLANEOUS) ×12
SEALER VESSEL DA VINCI XI (MISCELLANEOUS)
SEALER VESSEL EXT DVNC XI (MISCELLANEOUS) IMPLANT
SET IRRIG TUBING LAPAROSCOPIC (IRRIGATION / IRRIGATOR) IMPLANT
SOL ANTI FOG 6CC (MISCELLANEOUS) ×1 IMPLANT
SOLUTION ANTI FOG 6CC (MISCELLANEOUS) ×2
SOLUTION ELECTROLUBE (MISCELLANEOUS) ×3 IMPLANT
SPONGE T-LAP 18X18 ~~LOC~~+RFID (SPONGE) ×3 IMPLANT
SUT MNCRL AB 4-0 PS2 18 (SUTURE) ×3 IMPLANT
SYR 20ML LL LF (SYRINGE) ×3 IMPLANT
SYS RETRIEVAL 5MM INZII UNIV (BASKET) ×3
SYSTEM RETRIEVL 5MM INZII UNIV (BASKET) ×1 IMPLANT
TOWEL OR 17X26 10 PK STRL BLUE (TOWEL DISPOSABLE) ×3 IMPLANT
TOWEL OR NON WOVEN STRL DISP B (DISPOSABLE) ×3 IMPLANT
TRAY FOLEY MTR SLVR 16FR STAT (SET/KITS/TRAYS/PACK) IMPLANT
TUBING INSUFFLATION 10FT LAP (TUBING) ×3 IMPLANT

## 2021-02-18 NOTE — H&P (Signed)
Surgical Evaluation   Chief Complaint: abdominal pain   HPI: This is a very pleasant 60 year old woman who is referred for evaluation of possible biliary colic.  She has had several episodes of right flank pain radiating around to the abdominal region, queasiness, bloating and occasionally diarrhea.  She was found to have gallstones.  Symptoms are aggravated by eating, specifically greasy foods.  She has controlled her symptoms for the last couple weeks by maintaining a fairly low-fat diet.   Previous abdominal surgery includes laparoscopy for infertility many years ago.  She reports that she is otherwise in good health.   No Known Allergies       Past Medical History:  Diagnosis Date   Allergy     Hypertension     IBS (irritable bowel syndrome)             Past Surgical History:  Procedure Laterality Date   COLONOSCOPY   2015    [facility]   FOOT SURGERY Right 2014    bone spur   OVARIAN CYST REMOVAL   2001   REDUCTION MAMMAPLASTY Bilateral 2009           Family History  Problem Relation Age of Onset   Hypertension Mother     Dementia Mother 34   Hypertension Father     Vascular Disease Father     Diabetes type II Maternal Grandmother     Lung cancer Paternal Grandmother          millworker   Diabetes type II Paternal Grandfather        Social History         Socioeconomic History   Marital status: Married      Spouse name: Not on file   Number of children: Not on file   Years of education: Not on file   Highest education level: Not on file  Occupational History   Not on file  Tobacco Use   Smoking status: Never   Smokeless tobacco: Never  Vaping Use   Vaping Use: Never used  Substance and Sexual Activity   Alcohol use: No   Drug use: No   Sexual activity: Yes      Partners: Male      Birth control/protection: Post-menopausal  Other Topics Concern   Not on file  Social History Narrative   Not on file    Social Determinants of Health    Financial  Resource Strain: Not on file  Food Insecurity: Not on file  Transportation Needs: Not on file  Physical Activity: Not on file  Stress: Not on file  Social Connections: Not on file            Current Outpatient Medications on File Prior to Visit  Medication Sig Dispense Refill   ALPRAZolam (XANAX) 0.5 MG tablet Take 1/2 to 1 tablet at Bedtime if needed for Sleep 30 tablet 0   bisoprolol-hydrochlorothiazide (ZIAC) 10-6.25 MG tablet Take 1 tablet by mouth daily. 90 tablet 3   buPROPion (WELLBUTRIN XL) 300 MG 24 hr tablet TAKE 1 TABLET BY MOUTH  DAILY FOR MOOD 90 tablet 3   Cetirizine HCl (ZYRTEC ALLERGY PO) Take by mouth daily.       Cholecalciferol (VITAMIN D PO) Take 2,000 Units by mouth 2 (two) times daily.       dicyclomine (BENTYL) 20 MG tablet Take 1 tablet 3 x / day before meals for IBS 270 tablet 1   Estradiol 10 MCG TABS vaginal tablet INSERT 1  TABLET VAGINALLY 2 TIMES PER WEEK 24 tablet 3   montelukast (SINGULAIR) 10 MG tablet TAKE 1 TABLET BY MOUTH  DAILY FOR ALLERGIES 90 tablet 3   Multiple Vitamins-Minerals (WOMENS MULTI PO) Take by mouth daily.       olmesartan (BENICAR) 40 MG tablet Take 1 tablet Daily for BP 90 tablet 3   OVER THE COUNTER MEDICATION Aleve 2 tabs daily       vitamin C (ASCORBIC ACID) 500 MG tablet Take 500 mg by mouth daily.        No current facility-administered medications on file prior to visit.      Review of Systems: a complete, 10pt review of systems was completed with pertinent positives and negatives as documented in the HPI   Physical Exam: Weight: 178.25 lb   Height: 64 in  Body Surface Area: 1.86 m   Body Mass Index: 30.6 kg/m   Temp.: 97.8 F    Pulse: 66 (Regular)    P.OX: 98% (Room air) BP: 140/92(Sitting, Left Arm, Standard)   Alert, well-appearing Unlabored respirations Abdomen soft, nontender, nondistended.       CBC Latest Ref Rng & Units 10/23/2020 10/06/2020 03/31/2020  WBC 4.0 - 10.5 K/uL 12.3(H) 11.9(H) 10.6  Hemoglobin  12.0 - 15.0 g/dL 14.8 15.8(H) 15.8(H)  Hematocrit 36.0 - 46.0 % 43.6 46.6(H) 45.7(H)  Platelets 150 - 400 K/uL 178 206 197      CMP Latest Ref Rng & Units 11/16/2020 10/23/2020 10/06/2020  Glucose 65 - 99 mg/dL 69 96 79  BUN 7 - 25 mg/dL 31(H) 29(H) 16  Creatinine 0.50 - 1.05 mg/dL 0.86 0.81 0.71  Sodium 135 - 146 mmol/L 142 143 142  Potassium 3.5 - 5.3 mmol/L 4.2 3.7 3.9  Chloride 98 - 110 mmol/L 107 109 104  CO2 20 - 32 mmol/L '29 24 31  '$ Calcium 8.6 - 10.4 mg/dL 10.0 9.4 10.0  Total Protein 6.5 - 8.1 g/dL - 6.3(L) 6.7  Total Bilirubin 0.3 - 1.2 mg/dL - 0.3 0.4  Alkaline Phos 38 - 126 U/L - 86 -  AST 15 - 41 U/L - 24 22  ALT 0 - 44 U/L - 21 21      Recent Labs  No results found for: INR, PROTIME     Imaging: Imaging Results (Last 48 hours)  No results found.       A/P: BILIARY COLIC ([id number]) Story: I recommend proceeding with laparoscopic cholecystectomy with possible cholangiogram. Discussed risks of surgery including bleeding, pain, scarring, intraabdominal injury specifically to the common bile duct and sequelae, bile leak, conversion to open surgery, failure to resolve symptoms, blood clots/ pulmonary embolus, heart attack, pneumonia, stroke, death. Questions welcomed and answered to patient's satisfaction. She wishes to proceed with scheduling.           Patient Active Problem List    Diagnosis Date Noted   Major depressive disorder with single episode, in partial remission ([place]) 03/31/2020   B12 deficiency 03/31/2020   Obesity (BMI 30.0-34.9) 03/31/2020   Mixed hyperlipidemia 12/24/2014   Vitamin D deficiency 12/24/2014   Abnormal glucose 12/24/2014   Medication management 12/24/2014   Essential hypertension 04/29/2013          Romana Juniper, MD [facility]

## 2021-02-18 NOTE — Anesthesia Postprocedure Evaluation (Signed)
Anesthesia Post Note  Patient: Donna Chapman  Procedure(s) Performed: XI ROBOTIC ASSISTED LAPAROSCOPIC CHOLECYSTECTOMY (Abdomen)     Patient location during evaluation: PACU Anesthesia Type: General Level of consciousness: awake and alert Pain management: pain level controlled Vital Signs Assessment: post-procedure vital signs reviewed and stable Respiratory status: spontaneous breathing, nonlabored ventilation, respiratory function stable and patient connected to nasal cannula oxygen Cardiovascular status: blood pressure returned to baseline and stable Postop Assessment: no apparent nausea or vomiting Anesthetic complications: no   No notable events documented.  Last Vitals:  Vitals:   02/18/21 0915 02/18/21 0930  BP: (!) 131/51 139/65  Pulse: 64 63  Resp: 15 10  Temp:    SpO2: 100% 100%    Last Pain:  Vitals:   02/18/21 0930  TempSrc:   PainSc: 2                  [name]

## 2021-02-18 NOTE — Op Note (Signed)
Operative Note  Donna Chapman  [id number]  [id number]  02/18/2021   Surgeon: Romana Juniper MD FACS   Procedure performed: xi Robotic cholecystectomy   Preop diagnosis: Biliary colic Post-op diagnosis/intraop findings: Same, chronic cholecystitis, enlarged steatotic liver   Specimens: Gallbladder Retained items: No EBL: Minimal cc Complications: none   Description of procedure:  After obtaining informed consent the patient was brought to the operating room. Antibiotics were administered. SCD's were applied. General endotracheal anesthesia was initiated and a formal time-out was performed. The abdomen was prepped and draped in the usual sterile fashion and peritoneal access gained using a left subcostal veress needle after instilling the site with local. Insufflation to 48mHg was obtained, periumbilical 830mtrocar and camera inserted, and gross inspection revealed no evidence of injury from our entry.  Bilateral laparoscopic assisted taps blocks were performed with Exparel mixed with quarter percent Marcaine with epinephrine.  Three additional 8 mm robotic trocars were introduced in the right and left midclavicular and right anterior axillary lines under direct visualization and following infiltration with local.  The robot was then docked and instruments inserted under direct visualization.   She has a very large redundant falciform ligament and a significantly enlarged and steatotic liver.  The falciform ligament was divided using the hook electrocautery and taken down up towards the liver so that the camera and hook would not collide with the falciform throughout the case.  The left lobe of the liver is significantly enlarged and the "elbow"of the instruments in the left upper quadrant port had to be used to gently lift this throughout the case in order to visualize the cystic triangle.  The gallbladder was retracted cephalad and the infundibulum was retracted laterally. A combination of hook  electrocautery and blunt dissection was utilized to clear the peritoneum from the neck and cystic duct, circumferentially isolating the cystic artery and cystic duct and lifting the gallbladder from the cystic plate.    The critical view of safety was achieved with the cystic artery, cystic duct, and liver bed visualized between them with no other structures.  This was concordant with the view on firefly, with which the course of the common bile duct could also be visualized and noted to be well away from the field of dissection. The artery was somewhat diminutive and traversed along the anterior surface of the cystic duct and was divided with hook electrocautery.  The cystic duct was ligated with three clips on the proximal end and one distally and then divided sharply. The gallbladder was dissected from the liver plate using electrocautery.  Hemostasis along the liver bed was ensured with cautery as the dissection progressed. Once freed the gallbladder was placed in an endocatch bag and removed intact through the left upper quadrant trocar site.  Hemostasis was once again confirmed, and reinspection of the abdomen revealed no injuries. The clips were well opposed without any bile leak from the duct or the liver bed on direct visualization nor on firefly. The left upper quadrant extraction port site was closed with a 0 vicryl in the fascia under direct visualization using a PMI device. The abdomen was desufflated and all trocars removed. The skin incisions were closed with subcuticular 4-0 monocryl and Dermabond. The patient was awakened, extubated and transported to the recovery room in stable condition.   All counts were correct at the end of the case.

## 2021-02-18 NOTE — Discharge Instructions (Signed)
LAPAROSCOPIC SURGERY: POST OP INSTRUCTIONS   EAT Gradually transition to a high fiber diet with a fiber supplement over the next few weeks after discharge.  Start with a pureed / full liquid diet (see below)  WALK Walk an hour a day (cumulative- not all at once).  Control your pain to do that.    CONTROL PAIN Control pain so that you can walk, sleep, tolerate sneezing/coughing, go up/down stairs.  HAVE A BOWEL MOVEMENT DAILY Keep your bowels regular to avoid problems.  OK to try a laxative to override constipation.  OK to use an antidairrheal to slow down diarrhea.  Call if not better after 2 tries  CALL IF YOU HAVE PROBLEMS/CONCERNS Call if you are still struggling despite following these instructions. Call if you have concerns not answered by these instructions    DIET: Follow a light bland diet & liquids the first 24 hours after arrival home, such as soup, liquids, starches, etc.  Be sure to drink plenty of fluids.  Quickly advance to a usual solid diet within a few days.  Avoid fast food or heavy meals as your are more likely to get nauseated or have irregular bowels.  A low-sugar, high-fiber diet for the rest of your life is ideal.  Take your usually prescribed home medications unless otherwise directed.  PAIN CONTROL: Pain is best controlled by a usual combination of three different methods TOGETHER: Ice/Heat Over the counter pain medication Prescription pain medication Most patients will experience some swelling and bruising around the incisions.  Ice packs or heating pads (30-60 minutes up to 6 times a day) will help. Use ice for the first few days to help decrease swelling and bruising, then switch to heat to help relax tight/sore spots and speed recovery.  Some people prefer to use ice alone, heat alone, alternating between ice & heat.  Experiment to what works for you.  Swelling and bruising can take several weeks to resolve.   It is helpful to take an over-the-counter pain  medication regularly for the first few days: Naproxen (Aleve, etc)  Two 220mg tabs twice a day OR Ibuprofen (Advil, etc) Three 200mg tabs four times a day (every meal & bedtime) AND Acetaminophen (Tylenol, etc) 500-650mg four times a day (every meal & bedtime) A  prescription for pain medication (such as oxycodone, hydrocodone, tramadol, gabapentin, methocarbamol, etc) should be given to you upon discharge.  Take your pain medication as prescribed, IF NEEDED.  If you are having problems/concerns with the prescription medicine (does not control pain, nausea, vomiting, rash, itching, etc), please call us [phone] to see if we need to switch you to a different pain medicine that will work better for you and/or control your side effect better. If you need a refill on your pain medication, please give us 48 hour notice.  contact your pharmacy.  They will contact our office to request authorization. Prescriptions will not be filled after 5 pm or on week-ends  Avoid getting constipated.   Between the surgery and the pain medications, it is common to experience some constipation.   Increasing fluid intake and taking a fiber supplement (such as Metamucil, Citrucel, FiberCon, MiraLax, etc) 1-2 times a day regularly will usually help prevent this problem from occurring.   A mild laxative (prune juice, Milk of Magnesia, MiraLax, etc) should be taken according to package directions if there are no bowel movements after 48 hours.   Watch out for diarrhea.   If you have many loose   bowel movements, simplify your diet to bland foods & liquids for a few days.   Stop any stool softeners and decrease your fiber supplement.   Switching to mild anti-diarrheal medications (Kayopectate, Pepto Bismol) can help.   If this worsens or does not improve, please call us.  Wash / shower every day.  You may shower over the skin glue which is waterproof  Glue will flake off after about 2 weeks.  You may leave the incision  open to air.  You may replace a dressing/Band-Aid to cover the incision for comfort if you wish.   ACTIVITIES as tolerated:   You may resume regular (light) daily activities beginning the next day--such as daily self-care, walking, climbing stairs--gradually increasing activities as tolerated.  If you can walk 30 minutes without difficulty, it is safe to try more intense activity such as jogging, treadmill, bicycling, low-impact aerobics, swimming, etc. Save the most intensive and strenuous activity for last such as sit-ups, heavy lifting, contact sports, etc  Refrain from any heavy lifting or straining until you are off narcotics for pain control.   DO NOT PUSH THROUGH PAIN.  Let pain be your guide: If it hurts to do something, don't do it.  Pain is your body warning you to avoid that activity for another week until the pain goes down. You may drive when you are no longer taking prescription pain medication, you can comfortably wear a seatbelt, and you can safely maneuver your car and apply brakes. You may have sexual intercourse when it is comfortable.  FOLLOW UP in our office Please call [facility] at [phone] to set up an appointment to see your surgeon in the office for a follow-up appointment approximately 2-3 weeks after your surgery. Make sure that you call for this appointment the day you arrive home to insure a convenient appointment time.  10. IF YOU HAVE DISABILITY OR FAMILY LEAVE FORMS, BRING THEM TO THE OFFICE FOR PROCESSING.  DO NOT GIVE THEM TO YOUR DOCTOR.   WHEN TO CALL US [phone]: Poor pain control Reactions / problems with new medications (rash/itching, nausea, etc)  Fever over 101.5 F (38.5 C) Inability to urinate Nausea and/or vomiting Worsening swelling or bruising Continued bleeding from incision. Increased pain, redness, or drainage from the incision   The clinic staff is available to answer your questions during regular business hours (8:30am-5pm).  Please  don't hesitate to call and ask to speak to one of our nurses for clinical concerns.   If you have a medical emergency, go to the nearest emergency room or call 911.  A surgeon from [facility] is always on call at the hospitals   [facility] [address], [address], [place], [place]  [address] ? MAIN: [phone] ? TOLL FREE: [phone] ?  FAX [phone] [email or web address]  

## 2021-02-18 NOTE — Transfer of Care (Signed)
Immediate Anesthesia Transfer of Care Note  Patient: Donna Chapman  Procedure(s) Performed: XI ROBOTIC ASSISTED LAPAROSCOPIC CHOLECYSTECTOMY (Abdomen)  Patient Location: PACU  Anesthesia Type:General  Level of Consciousness: drowsy  Airway & Oxygen Therapy: Patient Spontanous Breathing and Patient connected to face mask  Post-op Assessment: Report given to RN and Post -op Vital signs reviewed and stable  Post vital signs: Reviewed and stable  Last Vitals:  Vitals Value Taken Time  BP 147/83 02/18/21 0850  Temp    Pulse 62 02/18/21 0853  Resp    SpO2 96 % 02/18/21 0853  Vitals shown include unvalidated device data.  Last Pain:  Vitals:   02/18/21 0618  TempSrc:   PainSc: 0-No pain      Patients Stated Pain Goal: 3 ([id number])  Complications: No notable events documented.

## 2021-02-18 NOTE — Anesthesia Preprocedure Evaluation (Addendum)
Anesthesia Evaluation  Patient identified by MRN, date of birth, ID band Patient awake    Reviewed: Allergy & Precautions, NPO status , Patient's Chart, lab work & pertinent test results, reviewed documented beta blocker date and time   Airway Mallampati: III  TM Distance: >3 FB Neck ROM: Full    Dental  (+) Teeth Intact, Dental Advisory Given, Chipped,    Pulmonary asthma ,    Pulmonary exam normal breath sounds clear to auscultation       Cardiovascular hypertension, Pt. on medications and Pt. on home beta blockers (-) angina(-) Past MI Normal cardiovascular exam Rhythm:Regular Rate:Normal     Neuro/Psych PSYCHIATRIC DISORDERS Depression negative neurological ROS     GI/Hepatic negative GI ROS, Biliary colic   Endo/Other  Obesity   Renal/GU negative Renal ROS     Musculoskeletal negative musculoskeletal ROS (+)   Abdominal   Peds  Hematology negative hematology ROS (+)   Anesthesia Other Findings Day of surgery medications reviewed with the patient.  Reproductive/Obstetrics                            Anesthesia Physical Anesthesia Plan  ASA: 2  Anesthesia Plan: General   Post-op Pain Management:    Induction: Intravenous  PONV Risk Score and Plan: 4 or greater and Scopolamine patch - Pre-op, Midazolam, Dexamethasone and Ondansetron  Airway Management Planned: Oral ETT  Additional Equipment:   Intra-op Plan:   Post-operative Plan: Extubation in OR  Informed Consent: I have reviewed the patients History and Physical, chart, labs and discussed the procedure including the risks, benefits and alternatives for the proposed anesthesia with the patient or authorized representative who has indicated his/her understanding and acceptance.     Dental advisory given  Plan Discussed with: CRNA  Anesthesia Plan Comments:         Anesthesia Quick Evaluation

## 2021-02-18 NOTE — Anesthesia Procedure Notes (Signed)
Procedure Name: Intubation Date/Time: 02/18/2021 7:25 AM Performed by: Claudia Desanctis, CRNA Pre-anesthesia Checklist: Patient identified, Emergency Drugs available, Suction available and Patient being monitored Patient Re-evaluated:Patient Re-evaluated prior to induction Oxygen Delivery Method: Circle system utilized Preoxygenation: Pre-oxygenation with 100% oxygen Induction Type: IV induction Ventilation: Mask ventilation without difficulty Laryngoscope Size: 2 and Miller Grade View: Grade II Tube type: Oral Number of attempts: 1 Airway Equipment and Method: Stylet Placement Confirmation: ETT inserted through vocal cords under direct vision, positive ETCO2 and breath sounds checked- equal and bilateral Tube secured with: Tape Dental Injury: Teeth and Oropharynx as per pre-operative assessment  Difficulty Due To: Difficult Airway- due to anterior larynx

## 2021-02-21 LAB — SURGICAL PATHOLOGY

## 2021-03-31 ENCOUNTER — Encounter: Payer: 59 | Admitting: Adult Health

## 2021-04-21 ENCOUNTER — Other Ambulatory Visit: Payer: Self-pay | Admitting: Adult Health Nurse Practitioner

## 2021-06-02 ENCOUNTER — Encounter: Payer: PRIVATE HEALTH INSURANCE | Admitting: Nurse Practitioner

## 2021-06-06 ENCOUNTER — Encounter: Payer: Self-pay | Admitting: Adult Health

## 2021-06-22 ENCOUNTER — Encounter: Payer: PRIVATE HEALTH INSURANCE | Admitting: Adult Health

## 2021-10-05 ENCOUNTER — Other Ambulatory Visit: Payer: Self-pay | Admitting: Internal Medicine

## 2021-10-05 ENCOUNTER — Other Ambulatory Visit: Payer: Self-pay | Admitting: Nurse Practitioner

## 2021-10-05 DIAGNOSIS — Z1231 Encounter for screening mammogram for malignant neoplasm of breast: Secondary | ICD-10-CM

## 2021-10-10 ENCOUNTER — Other Ambulatory Visit: Payer: Self-pay | Admitting: Nurse Practitioner

## 2021-10-10 DIAGNOSIS — Z1231 Encounter for screening mammogram for malignant neoplasm of breast: Secondary | ICD-10-CM

## 2021-12-01 ENCOUNTER — Ambulatory Visit: Payer: No Typology Code available for payment source

## 2021-12-14 ENCOUNTER — Ambulatory Visit (INDEPENDENT_AMBULATORY_CARE_PROVIDER_SITE_OTHER): Payer: 59

## 2021-12-14 DIAGNOSIS — Z1231 Encounter for screening mammogram for malignant neoplasm of breast: Secondary | ICD-10-CM | POA: Diagnosis not present

## 2022-04-03 DIAGNOSIS — Z6832 Body mass index (BMI) 32.0-32.9, adult: Secondary | ICD-10-CM | POA: Diagnosis not present

## 2022-04-03 DIAGNOSIS — Z01419 Encounter for gynecological examination (general) (routine) without abnormal findings: Secondary | ICD-10-CM | POA: Diagnosis not present

## 2022-04-07 DIAGNOSIS — Z23 Encounter for immunization: Secondary | ICD-10-CM | POA: Diagnosis not present

## 2022-07-10 DIAGNOSIS — Z6832 Body mass index (BMI) 32.0-32.9, adult: Secondary | ICD-10-CM | POA: Diagnosis not present

## 2022-07-10 DIAGNOSIS — I10 Essential (primary) hypertension: Secondary | ICD-10-CM | POA: Diagnosis not present

## 2022-07-10 DIAGNOSIS — J0140 Acute pansinusitis, unspecified: Secondary | ICD-10-CM | POA: Diagnosis not present

## 2022-07-12 DIAGNOSIS — D225 Melanocytic nevi of trunk: Secondary | ICD-10-CM | POA: Diagnosis not present

## 2022-07-12 DIAGNOSIS — L821 Other seborrheic keratosis: Secondary | ICD-10-CM | POA: Diagnosis not present

## 2022-07-12 DIAGNOSIS — L814 Other melanin hyperpigmentation: Secondary | ICD-10-CM | POA: Diagnosis not present

## 2022-09-08 DIAGNOSIS — B349 Viral infection, unspecified: Secondary | ICD-10-CM | POA: Diagnosis not present

## 2022-09-08 DIAGNOSIS — R051 Acute cough: Secondary | ICD-10-CM | POA: Diagnosis not present

## 2023-01-10 DIAGNOSIS — Z1211 Encounter for screening for malignant neoplasm of colon: Secondary | ICD-10-CM | POA: Diagnosis not present

## 2023-01-10 DIAGNOSIS — K5730 Diverticulosis of large intestine without perforation or abscess without bleeding: Secondary | ICD-10-CM | POA: Diagnosis not present

## 2023-01-26 ENCOUNTER — Other Ambulatory Visit: Payer: Self-pay | Admitting: Family Medicine

## 2023-01-26 DIAGNOSIS — Z1231 Encounter for screening mammogram for malignant neoplasm of breast: Secondary | ICD-10-CM

## 2023-01-29 DIAGNOSIS — J069 Acute upper respiratory infection, unspecified: Secondary | ICD-10-CM | POA: Diagnosis not present

## 2023-02-19 DIAGNOSIS — Z6831 Body mass index (BMI) 31.0-31.9, adult: Secondary | ICD-10-CM | POA: Diagnosis not present

## 2023-02-19 DIAGNOSIS — J302 Other seasonal allergic rhinitis: Secondary | ICD-10-CM | POA: Diagnosis not present

## 2023-02-19 DIAGNOSIS — F32A Depression, unspecified: Secondary | ICD-10-CM | POA: Diagnosis not present

## 2023-02-19 DIAGNOSIS — E669 Obesity, unspecified: Secondary | ICD-10-CM | POA: Diagnosis not present

## 2023-02-19 DIAGNOSIS — Z0000 Encounter for general adult medical examination without abnormal findings: Secondary | ICD-10-CM | POA: Diagnosis not present

## 2023-02-19 DIAGNOSIS — Z86010 Personal history of colonic polyps: Secondary | ICD-10-CM | POA: Diagnosis not present

## 2023-02-19 DIAGNOSIS — K529 Noninfective gastroenteritis and colitis, unspecified: Secondary | ICD-10-CM | POA: Diagnosis not present

## 2023-02-19 DIAGNOSIS — Z13220 Encounter for screening for lipoid disorders: Secondary | ICD-10-CM | POA: Diagnosis not present

## 2023-02-19 DIAGNOSIS — I10 Essential (primary) hypertension: Secondary | ICD-10-CM | POA: Diagnosis not present

## 2023-02-22 DIAGNOSIS — M1990 Unspecified osteoarthritis, unspecified site: Secondary | ICD-10-CM | POA: Diagnosis not present

## 2023-02-22 DIAGNOSIS — Z791 Long term (current) use of non-steroidal anti-inflammatories (NSAID): Secondary | ICD-10-CM | POA: Diagnosis not present

## 2023-02-22 DIAGNOSIS — E669 Obesity, unspecified: Secondary | ICD-10-CM | POA: Diagnosis not present

## 2023-02-22 DIAGNOSIS — F325 Major depressive disorder, single episode, in full remission: Secondary | ICD-10-CM | POA: Diagnosis not present

## 2023-02-22 DIAGNOSIS — E785 Hyperlipidemia, unspecified: Secondary | ICD-10-CM | POA: Diagnosis not present

## 2023-02-22 DIAGNOSIS — I10 Essential (primary) hypertension: Secondary | ICD-10-CM | POA: Diagnosis not present

## 2023-02-22 DIAGNOSIS — Z6831 Body mass index (BMI) 31.0-31.9, adult: Secondary | ICD-10-CM | POA: Diagnosis not present

## 2023-02-22 DIAGNOSIS — Z8249 Family history of ischemic heart disease and other diseases of the circulatory system: Secondary | ICD-10-CM | POA: Diagnosis not present

## 2023-03-14 ENCOUNTER — Ambulatory Visit: Payer: Self-pay

## 2023-03-15 ENCOUNTER — Ambulatory Visit (INDEPENDENT_AMBULATORY_CARE_PROVIDER_SITE_OTHER): Payer: 59

## 2023-03-15 DIAGNOSIS — Z1231 Encounter for screening mammogram for malignant neoplasm of breast: Secondary | ICD-10-CM | POA: Diagnosis not present

## 2023-03-26 DIAGNOSIS — D72829 Elevated white blood cell count, unspecified: Secondary | ICD-10-CM | POA: Diagnosis not present

## 2023-05-02 DIAGNOSIS — Z01419 Encounter for gynecological examination (general) (routine) without abnormal findings: Secondary | ICD-10-CM | POA: Diagnosis not present

## 2023-05-02 DIAGNOSIS — Z6831 Body mass index (BMI) 31.0-31.9, adult: Secondary | ICD-10-CM | POA: Diagnosis not present

## 2023-05-02 DIAGNOSIS — Z124 Encounter for screening for malignant neoplasm of cervix: Secondary | ICD-10-CM | POA: Diagnosis not present

## 2023-05-02 DIAGNOSIS — Z1151 Encounter for screening for human papillomavirus (HPV): Secondary | ICD-10-CM | POA: Diagnosis not present

## 2023-05-02 DIAGNOSIS — N952 Postmenopausal atrophic vaginitis: Secondary | ICD-10-CM | POA: Diagnosis not present

## 2023-07-03 DIAGNOSIS — S46912A Strain of unspecified muscle, fascia and tendon at shoulder and upper arm level, left arm, initial encounter: Secondary | ICD-10-CM | POA: Diagnosis not present

## 2023-10-16 DIAGNOSIS — N952 Postmenopausal atrophic vaginitis: Secondary | ICD-10-CM | POA: Diagnosis not present

## 2023-10-16 DIAGNOSIS — E785 Hyperlipidemia, unspecified: Secondary | ICD-10-CM | POA: Diagnosis not present

## 2023-10-16 DIAGNOSIS — I10 Essential (primary) hypertension: Secondary | ICD-10-CM | POA: Diagnosis not present

## 2023-10-16 DIAGNOSIS — Z8249 Family history of ischemic heart disease and other diseases of the circulatory system: Secondary | ICD-10-CM | POA: Diagnosis not present

## 2023-10-16 DIAGNOSIS — J45909 Unspecified asthma, uncomplicated: Secondary | ICD-10-CM | POA: Diagnosis not present

## 2023-10-16 DIAGNOSIS — F325 Major depressive disorder, single episode, in full remission: Secondary | ICD-10-CM | POA: Diagnosis not present

## 2023-10-16 DIAGNOSIS — E669 Obesity, unspecified: Secondary | ICD-10-CM | POA: Diagnosis not present

## 2023-10-16 DIAGNOSIS — M62838 Other muscle spasm: Secondary | ICD-10-CM | POA: Diagnosis not present

## 2023-10-16 DIAGNOSIS — Z6831 Body mass index (BMI) 31.0-31.9, adult: Secondary | ICD-10-CM | POA: Diagnosis not present

## 2023-10-16 DIAGNOSIS — Z79890 Hormone replacement therapy (postmenopausal): Secondary | ICD-10-CM | POA: Diagnosis not present

## 2024-02-22 DIAGNOSIS — Z0000 Encounter for general adult medical examination without abnormal findings: Secondary | ICD-10-CM | POA: Diagnosis not present

## 2024-02-22 DIAGNOSIS — R8290 Unspecified abnormal findings in urine: Secondary | ICD-10-CM | POA: Diagnosis not present

## 2024-02-23 DIAGNOSIS — R300 Dysuria: Secondary | ICD-10-CM | POA: Diagnosis not present

## 2024-02-23 DIAGNOSIS — R399 Unspecified symptoms and signs involving the genitourinary system: Secondary | ICD-10-CM | POA: Diagnosis not present

## 2024-02-23 DIAGNOSIS — R350 Frequency of micturition: Secondary | ICD-10-CM | POA: Diagnosis not present

## 2024-04-16 DIAGNOSIS — D1801 Hemangioma of skin and subcutaneous tissue: Secondary | ICD-10-CM | POA: Diagnosis not present

## 2024-04-16 DIAGNOSIS — L814 Other melanin hyperpigmentation: Secondary | ICD-10-CM | POA: Diagnosis not present

## 2024-04-16 DIAGNOSIS — L821 Other seborrheic keratosis: Secondary | ICD-10-CM | POA: Diagnosis not present

## 2024-04-16 DIAGNOSIS — Z7189 Other specified counseling: Secondary | ICD-10-CM | POA: Diagnosis not present

## 2024-04-28 ENCOUNTER — Other Ambulatory Visit: Payer: Self-pay | Admitting: Family Medicine

## 2024-04-28 DIAGNOSIS — Z1231 Encounter for screening mammogram for malignant neoplasm of breast: Secondary | ICD-10-CM

## 2024-05-15 ENCOUNTER — Ambulatory Visit

## 2024-05-15 DIAGNOSIS — Z1231 Encounter for screening mammogram for malignant neoplasm of breast: Secondary | ICD-10-CM

## 2024-05-22 DIAGNOSIS — Z01419 Encounter for gynecological examination (general) (routine) without abnormal findings: Secondary | ICD-10-CM | POA: Diagnosis not present

## 2024-05-22 DIAGNOSIS — N952 Postmenopausal atrophic vaginitis: Secondary | ICD-10-CM | POA: Diagnosis not present

## 2024-05-22 DIAGNOSIS — Z6832 Body mass index (BMI) 32.0-32.9, adult: Secondary | ICD-10-CM | POA: Diagnosis not present

## 2024-05-22 DIAGNOSIS — R319 Hematuria, unspecified: Secondary | ICD-10-CM | POA: Diagnosis not present

## 2025-01-26 ENCOUNTER — Ambulatory Visit: Admitting: Family Medicine
